# Patient Record
Sex: Female | Born: 1960 | Race: White | Hispanic: No | Marital: Married | Smoking: Current every day smoker
Health system: Southern US, Community
[De-identification: ages and names within clinical notes are randomized; demographics above are authoritative.]

## PROBLEM LIST (undated history)

## (undated) DIAGNOSIS — I726 Aneurysm of vertebral artery: Secondary | ICD-10-CM

## (undated) DIAGNOSIS — Z87898 Personal history of other specified conditions: Secondary | ICD-10-CM

## (undated) DIAGNOSIS — E041 Nontoxic single thyroid nodule: Secondary | ICD-10-CM

## (undated) DIAGNOSIS — I10 Essential (primary) hypertension: Secondary | ICD-10-CM

## (undated) DIAGNOSIS — R519 Headache, unspecified: Secondary | ICD-10-CM

## (undated) DIAGNOSIS — R7301 Impaired fasting glucose: Secondary | ICD-10-CM

## (undated) DIAGNOSIS — J45909 Unspecified asthma, uncomplicated: Secondary | ICD-10-CM

## (undated) DIAGNOSIS — M1990 Unspecified osteoarthritis, unspecified site: Secondary | ICD-10-CM

## (undated) DIAGNOSIS — Z8489 Family history of other specified conditions: Secondary | ICD-10-CM

## (undated) DIAGNOSIS — D649 Anemia, unspecified: Secondary | ICD-10-CM

## (undated) HISTORY — DX: Nontoxic single thyroid nodule: E04.1

## (undated) HISTORY — PX: CHOLECYSTECTOMY OPEN: SUR202

## (undated) HISTORY — DX: Essential (primary) hypertension: I10

## (undated) HISTORY — DX: Personal history of other specified conditions: Z87.898

## (undated) HISTORY — DX: Impaired fasting glucose: R73.01

## (undated) HISTORY — PX: PLACEMENT OF BREAST IMPLANTS: SHX6334

## (undated) HISTORY — DX: Aneurysm of vertebral artery: I72.6

## (undated) HISTORY — PX: OTHER SURGICAL HISTORY: SHX169

## (undated) HISTORY — PX: ABDOMINAL HYSTERECTOMY: SHX81

---

## 2001-05-30 ENCOUNTER — Encounter: Payer: Self-pay | Admitting: Family Medicine

## 2001-05-30 ENCOUNTER — Ambulatory Visit (HOSPITAL_COMMUNITY): Admission: RE | Admit: 2001-05-30 | Discharge: 2001-05-30 | Payer: Self-pay | Admitting: Family Medicine

## 2001-11-15 HISTORY — PX: ESOPHAGOGASTRODUODENOSCOPY: SHX1529

## 2002-10-15 ENCOUNTER — Encounter: Payer: Self-pay | Admitting: *Deleted

## 2002-10-15 ENCOUNTER — Emergency Department (HOSPITAL_COMMUNITY): Admission: EM | Admit: 2002-10-15 | Discharge: 2002-10-15 | Payer: Self-pay | Admitting: *Deleted

## 2002-10-31 ENCOUNTER — Ambulatory Visit (HOSPITAL_COMMUNITY): Admission: RE | Admit: 2002-10-31 | Discharge: 2002-10-31 | Payer: Self-pay | Admitting: Internal Medicine

## 2003-07-27 ENCOUNTER — Emergency Department (HOSPITAL_COMMUNITY): Admission: EM | Admit: 2003-07-27 | Discharge: 2003-07-27 | Payer: Self-pay | Admitting: *Deleted

## 2004-06-19 ENCOUNTER — Ambulatory Visit (HOSPITAL_COMMUNITY): Admission: RE | Admit: 2004-06-19 | Discharge: 2004-06-19 | Payer: Self-pay | Admitting: Family Medicine

## 2007-04-01 ENCOUNTER — Emergency Department (HOSPITAL_COMMUNITY): Admission: EM | Admit: 2007-04-01 | Discharge: 2007-04-01 | Payer: Self-pay | Admitting: Emergency Medicine

## 2007-04-18 ENCOUNTER — Ambulatory Visit (HOSPITAL_COMMUNITY): Admission: RE | Admit: 2007-04-18 | Discharge: 2007-04-18 | Payer: Self-pay | Admitting: Family Medicine

## 2007-06-12 ENCOUNTER — Observation Stay (HOSPITAL_COMMUNITY): Admission: RE | Admit: 2007-06-12 | Discharge: 2007-06-13 | Payer: Self-pay | Admitting: Obstetrics and Gynecology

## 2007-06-12 ENCOUNTER — Encounter: Payer: Self-pay | Admitting: Obstetrics and Gynecology

## 2009-09-10 ENCOUNTER — Ambulatory Visit (HOSPITAL_COMMUNITY): Admission: RE | Admit: 2009-09-10 | Discharge: 2009-09-10 | Payer: Self-pay | Admitting: Internal Medicine

## 2010-12-06 ENCOUNTER — Encounter: Payer: Self-pay | Admitting: Family Medicine

## 2011-03-30 NOTE — Op Note (Signed)
Darlene [name], Darlene [name]                 ACCOUNT NO.:  [id number]   MEDICAL RECORD NO.:  [id number]          PATIENT TYPE:  INP   LOCATION:  A313                          FACILITY:  [facility]   PHYSICIAN:  Dennie Maizes, M.D.   DATE OF BIRTH:  1960/12/22   DATE OF PROCEDURE:  06/12/2007  DATE OF DISCHARGE:                               OPERATIVE REPORT   INTRAOPERATIVE CONSULTATION/OPERATIVE REPORT:   PREOPERATIVE DIAGNOSIS:  Possible bladder injury during vaginal  hysterectomy.   POSTOPERATIVE DIAGNOSIS:  No bladder injury.   OPERATIVE PROCEDURE:  Cystoscopy.   ANESTHESIA:  General.   SURGEON:  Dennie Maizes, M.D.   ESTIMATED BLOOD LOSS:  Minimal.   DRAINS:  16-French Foley catheter in the bladder.   COMPLICATIONS:  None.   INDICATIONS FOR PROCEDURE:  This 50 year old female had metromenorrhagia  and hydrosalpinx.  She was scheduled to undergo laparoscopy assisted  vaginal hysterectomy, bilateral salpingo-oophorectomy and  hydrosalpingectomy by Dr. Emelda Fear.  During the procedure, he suspected  bladder injury.  I was asked to see the patient intraoperatively to  evaluate the bladder.   DESCRIPTION OF PROCEDURE:  Dr. Emelda Fear had completed lap assisted  vaginal hysterectomy.  He was closing the vaginal incision.  The Foley  catheter was removed.  The patient had her bladder filled with sterile  milk.  Irrigation of the bladder was done after introducing the  cystoscope.  The bladder was then closely inspected with 30 degree and  70 degree lenses.  The trigone, ureteral orifice and bladder mucosa were  normal.  There was no evidence  of any injury to the bladder.  While the bladder was being irrigated,  there was no leak of urine through the vagina.  There was no evidence of  any bladder injury.  The Foley catheter was then reinserted.  Dr.  Emelda Fear then proceeded with the surgery.      Dennie Maizes, M.D.  Electronically Signed     SK/MEDQ  D:  06/12/2007  T:   06/12/2007  Job:  [id number]

## 2011-03-30 NOTE — Op Note (Signed)
Darlene [name], Darlene [name]                 ACCOUNT NO.:  [id number]   MEDICAL RECORD NO.:  [id number]          PATIENT TYPE:  INP   LOCATION:  A313                          FACILITY:  [facility]   PHYSICIAN:  Tilda Burrow, M.D. DATE OF BIRTH:  Mar 09, 1961   DATE OF PROCEDURE:  06/12/2007  DATE OF DISCHARGE:                               OPERATIVE REPORT   PREOP:  1. Menometrorrhagia.  2. Hydrosalpinx.   POSTOP:  1. Menometrorrhagia.  2. Right paratubal cyst.   PROCEDURE:  1. Laparoscopically-assisted vaginal hysterectomy.  2. Excision right paratubal cyst.   INTRAOPERATIVE CONSULTS:  Dennie Maizes, with cystoscopy.   SURGEON:  1. Ferguson for hysterectomy and excision of cyst.  2. Cystoscopy, Dr. Rito Ehrlich.   ANESTHESIA:  General.   COMPLICATIONS:  None.   FINDINGS:  1. Mobile, twice normal size, uterus with irregular boggy tissues.  2. Normal appearing tubes bilaterally.  3. Normal ovaries.  4. Right paratubal cyst.  5. No evidence of endometriosis or scarring.   DETAILS OF PROCEDURE:  The patient was taken to the operating room,  prepped and draped for combined abdominal and vaginal procedure with  Foley catheter in place and abdomen and vagina areas prepped and draped.  An infraumbilical vertical skin incision was made as well as a  transverse suprapubic skin incision.  A Veress needle was introduced  through the umbilicus, being careful to orient the needle toward the  pelvis and water droplet technique confirmed the intraperitoneal  location of the needle with 3L CO2 instilled.  Laparoscopic trocar  inserted under direct visualization and inspection of the pelvis  performed.  Photos were taken.  The suprapubic trocar was placed under  direct visualization, attention first directed to the right side.  First, a right lower quadrant 10 mm trocar was placed under direct  visualization so we could use the ligature bipolar cutting and  coagulating device.  The paratubal cyst  was excised.  We then used the  uterine manipulator that had been placed per vagina to manipulate the  uterus such that we could take down the round ligament, utero-ovarian  ligament and broad ligament using the ligature, this was performed on  both sides.  This was taken down to just above the uterine vessels, and  the bladder flap then mobilized using sharp Metzenbaum scissors to  develop a bladder flap.  Blunt dissection using spreading technique was  used to identify the vesicouterine space sufficiently, to easily enter  the vesicouterine space.   At this point we deflated the abdomen, left the trocars in place with  instruments out of the abdomen, and went vaginally for the remainder of  the case.  The weighted speculum was placed in the posterior vagina, the  cervix grasped with a single-tooth tenaculum and circumscribed with  unipolar cautery.  The posterior colpotomy incision was then performed  with Mayo scissors, the peritoneal cavity entered and the uterosacral  ligaments on each side clamped with Zeppelin clamp, transected and #0  chromic suture ligation performed.  The lower cardinal ligaments were  then clamped, cut and suture ligated, the  bladder flap having been  elevated anteriorly.  We then were able to enter completely using blunt  dissection with the index finger into the anterior vesicouterine space  from below as we dealt with the upper cardinal ligaments on either side,  clamping, cutting and suture ligating on either side.  There was a  greater amount of fluid from the anterior vesicouterine reflection than  was expected, probably 20-40 mL of apparently lightly bloody stained  fluid, raising the question of peritoneal fluid vs. urine.  We attempted  to assess this by first placing 120 mL of sterile water in the bladder,  waiting for 5 minutes and then aspirating it and all that was obtained.  The sterile milk was placed in the bladder as well, 120 mL, and left in   for the remainder of the resection.  Intraoperative consult with Dr. Mariann Barter  peptide was called.  We then marched up the remainder of the broad  ligament, clamping, cutting and suture ligating on each side using #0  Chromic suture ligature as needed after a Zeppelin clamp and Mayo  scissor transection of pedicles.  Once the uterine specimen had been  taken out, there had been no evidence of any sterile milk egress into  the surgical area.  This result was discussed with Dr. Rito Ehrlich and  decision made to proceed with cystoscopy as an additional precaution.  Cystoscopy was performed by Dr. Rito Ehrlich, see his notes for details.  There was no evidence of injury or bleeding, injury to the bladder on  his cystoscopy.   The Foley catheter was reinserted at the completion of Dr. Chancy Milroy  procedure and the peritoneum then closed.  We first attached the cul-de-  sac to the right uterosacral ligament with a suture of #2-0 Prolene,  which would give [place] good vaginal apex support, and then the peritoneal  cavity was closed using running locking #2-0 Chromic across in a  transverse fashion.  The vaginal cuff was then closed, first attaching  some of the cul-de-sac with a #2-0 Chromic suture to the uterosacral  ligament and lower cardinal ligament pedicle on each side, then the  remainder of the cuff was closed using #2-0 chromic running fashion to  close the anterior flap to the posterior skin flap to the vaginal apex.  Patient tolerated procedure well, vaginal packing was placed.   ESTIMATED BLOOD LOSS:  100 mL.   SPONGE AND NEEDLE COUNTS:  Correct throughout the case.   We then went up to the abdominal wall, changed gloves and closed the  laparoscopic ports by removal of the ports, placing 60 mL of saline in  the abdominal cavity, closing the right lower quadrant and umbilical  site at the level of the fascia with #0 Vicryl and then subcuticular #3-  0 Vicryl at each site before Steri-stripping the  incisions.  Hemostasis  was excellent.  Sponge and needle counts correct.  The patient went to  recovery room in excellent condition.     Tilda Burrow, M.D.  Electronically Signed    JVF/MEDQ  D:  06/12/2007  T:  06/12/2007  Job:  [id number]

## 2011-03-30 NOTE — Discharge Summary (Signed)
Darlene [name], Darlene [name]                 ACCOUNT NO.:  [id number]   MEDICAL RECORD NO.:  [id number]          PATIENT TYPE:  INP   LOCATION:  A313                          FACILITY:  [facility]   PHYSICIAN:  Tilda Burrow, M.D. DATE OF BIRTH:  08/05/1961   DATE OF ADMISSION:  06/12/2007  DATE OF DISCHARGE:  07/29/2008LH                               DISCHARGE SUMMARY   ADMISSION DIAGNOSES:  1. Menometrorrhagia.  2. Right hydrosalpinx.   POSTOPERATIVE DIAGNOSES:  1. Menometrorrhagia.  2. Right paratubal cyst.  3. No evidence of bladder injury.   PROCEDURE:  Laparoscopically-assisted vaginal hysterectomy with right  paratubal cyst removal, Tilda Burrow, M.D.  Intraoperative consult  and cystoscopy, Dennie Maizes, M.D.   HOSPITAL SUMMARY:  This 50 year old gravida 2, para 2, referred by Dr.  Gerda Diss for heavy menses x1 year is admitted for a hysterectomy with a  small uterus, suspected bilateral hydrosalpinx adjacent to normal  ovaries.  See HPI for details.  She was admitted with a hemoglobin of  15, hematocrit of 44, potassium 5.1, rechecked at 4.9.  BUN and  creatinine was at 9.0.   HOSPITAL COURSE:  The patient underwent laparoscopically-assisted  vaginal hysterectomy.  Laparoscopy revealed that the tubes were grossly  normal but there was a paratubal cyst just below the right tube, which  was taken off.  The laparoscopically-assisted vaginal hysterectomy was  straightforward, with one concern being developed, namely that we were  concerned about an increased amount of peritoneal fluid encountered  during the dissection of the vesicouterine space raising the question of  bladder injury, and so therefore we asked Dr. Rito Ehrlich to evaluate in  addition to our intraoperative evaluation with sterile milk.  The  patient had no evidence of bladder injury.  Postoperatively the patient  did well, was sitting up with active bowel sounds, tolerating a regular  diet, afebrile, with hematocrit  10.6, hematocrit 31.2 on postop day #1.  She will be discharged home for follow-up in 2 weeks at our  office with discharge medications consisting of Percocet 5/325 mg,  Toradol 10 mg q.6h. p.r.n. cramping, and doxycycline 100 mg b.i.d. x5  days as prophylaxis.  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      Tilda Burrow, M.D.  Electronically Signed     JVF/MEDQ  D:  06/13/2007  T:  06/13/2007  Job:  [id number]   cc:   Lorin Picket A. Gerda Diss, MD  Fax: [phone]

## 2011-04-02 NOTE — Op Note (Signed)
NAME:  Darlene [name], Darlene [name]                           ACCOUNT NO.:  [id number]   MEDICAL RECORD NO.:  [id number]                   PATIENT TYPE:  AMB   LOCATION:  DAY                                  FACILITY:  [facility]   PHYSICIAN:  R. Roetta Sessions, M.D.              DATE OF BIRTH:  Apr 06, 1961   DATE OF PROCEDURE:  10/31/2002  DATE OF DISCHARGE:                                 OPERATIVE REPORT   PROCEDURE:  Diagnostic esophagogastroduodenoscopy.   ENDOSCOPIST:  Gerrit Friends. Rourk, M.D.   INDICATIONS FOR PROCEDURE:  The patient is a 50 year old lady with  intermittent epigastric gnawing abdominal pain.  She takes ibuprofen  regularly.  We saw her in the office on 10/19/02 and recommended that she  increase her Nexium to 40 gm b.i.d. and add Benefiber and NuLev to her  regimen.  She has not done any of the above.  In fact she has run out of her  Nexium.  She is not taking Benefiber or NuLev.  EGD is now being done to  further evaluate her symptoms.  The approach has been discussed with the  patient.  The potential risks, benefits, and alternatives have been  reviewed; and questions answered.  ASA I.   DESCRIPTION OF PROCEDURE:  O2 saturation, blood pressure, pulse and  respirations were monitored throughout the entire procedure.   CONSCIOUS SEDATION:  Versed 5 mg IV, Demerol 75 mg IV in divided doses,  Cetacaine spray for topical oropharyngeal anesthesia.   INSTRUMENT:  Olympus video chip adult gastroscope.   FINDINGS:  Examination of the tubular esophagus revealed no mucosa  abnormalities.  The EG junction was easily traversed.   STOMACH:  The gastric cavity was empty.  It insufflated well with air.  A  thorough examination of the gastric mucosa including a retroflex view of the  proximal stomach and esophagogastric junction demonstrated multiple linear  antral erosions.  There was no ulcer or infiltrating process or other  abnormality.  The pylorus was patent and easily traversed.   DUODENUM:  The bulb and the second portion were well seen.  No posterior  bulbar erosions.  Otherwise D1 and D2 appeared normal.   THERAPY/DIAGNOSTIC MANEUVERS:  None.   The patient tolerated the procedure well and was reacted in endoscopy.   IMPRESSION:  1. Normal esophagus.  2. Antral and bulbar erosions.  The remainder of the gastric mucosa and     duodenum through the second portion appeared normal.   RECOMMENDATIONS:  1. Begin Nexium 40 mg orally b.i.d. for the next month.  2. NuLev 1 tablet before meals and at bedtime orally p.r.n. epigastric     discomfort.  3. Will go ahead and check LFT, CBC, and amylase and obtain H. pylori     serologies.  4. Office visit with [place] to assess her progress in 1 month.  Jonathon Bellows, M.D.    RMR/MEDQ  D:  10/31/2002  T:  10/31/2002  Job:  [id number]   cc:   Lorin Picket A. Gerda Diss, M.D.  [address], [address]  [place]  [place] [address]  Fax: [phone]

## 2011-04-02 NOTE — Consult Note (Signed)
Darlene [name], Darlene [name]                             ACCOUNT NO.:  [id number]   MEDICAL RECORD NO.:  [id number]                  PATIENT TYPE:   LOCATION:                                       FACILITY:   PHYSICIAN:  R. Roetta Sessions, M.D.              DATE OF BIRTH:  06-01-1961   DATE OF CONSULTATION:  10/19/2002  DATE OF DISCHARGE:                                   CONSULTATION   PHYSICIAN REQUESTING CONSULTATION:  Donna Bernard, M.D.   REASON FOR CONSULTATION:  Epigastric pain.   HISTORY OF PRESENT ILLNESS:  The patient is a 50 year old Caucasian female  patient of Dr. Lubertha South who presents today for further evaluation of  recurrent epigastric pain.  She gives a chronic history of epigastric  discomfort.  She says she has had symptoms for many years.  She often has a  constant gnawing-type feeling in her epigastric region.  Recently, however,  she has begun having recurrent severe attacks where she develops spasm-like  epigastric pain which radiates into her back.  It generally lasts for 15-30  minutes at a time.  The other day, she had symptoms for over an hour, at  which time she broke out with a cold sweat and had nausea and vomiting.  She  went to the emergency department and reportedly had an acute abdominal  series which was negative.  She denies having any other kind of blood tests  or workup.  She was given Demerol and GI cocktail and the pain resolved.  She had another attack this morning and took GI cocktail with relief.  She  denies any typical heartburn symptoms, dysphagia, odynophagia, weight loss,  diarrhea, melena, or rectal bleeding.  She does have some constipation.  Generally goes to the bathroom only every few days or even just once a week.  Her severe epigastric pain attacks that she has generally wake her up from  sleep.  They are usually not related to eating.  She says that the pain is  similar to when she had gallbladder attacks in the past.  She had  her  gallbladder removed in 1995 for gallstones.   She does admit to taking lots of ibuprofen for lower back pain and knee  pain.  She is taking 800 mg once to twice daily.  She has had to come off of  ibuprofen previously because of significant abdominal pain.  She has been on  Nexium for several months but takes it sporadically.  She will take it for a  week or two at a time and then get to feeling better and will stop the  medication.  Currently, she is taking it about once every other day.  She  says she was asked to increase to twice a day by Dr. Lubertha South.  She has  yet to do this.   CURRENT MEDICATIONS:  1. Nexium 40 mg  p.o. q.o.d.  2. Tylenol p.r.n.  3. Ibuprofen 800 mg 1-2 times daily.   ALLERGIES:  Codeine causes panic attacks.   PAST MEDICAL HISTORY:  She gives history of arthritis but no other chronic  illnesses.   PAST SURGICAL HISTORY:  Cholecystectomy for cholelithiasis in 1995.   FAMILY HISTORY:  Mother has a history of H. pylori, status post treatment.  Father had triple coronary artery bypass graft.  No family history of  colorectal cancer.   SOCIAL HISTORY:  She has been married for 24 years.  She has three children.  She works for [employer] in [employer].  She smokes one pack of  cigarettes daily.  She has smoked for 15 years.  Denies any alcohol use.   REVIEW OF SYSTEMS:  Please see HPI for GI.  GENERAL:  Denies any weight  loss.  CARDIOPULMONARY:  Denies any shortness of breath or chest pain.  GENITOURINARY:  Denies any dysuria or urinary frequency or hematuria.  She  complains of severe menstrual cramps.   PHYSICAL EXAMINATION:  VITAL SIGNS:  Weight 134.7 pounds, height 5 feet 3  inches, temperature 98.4 degrees, blood pressure 110/80, pulse 82.  GENERAL:  A pleasant, well-nourished, well-developed Caucasian female in no  acute distress.  SKIN:  Warm and dry.  No jaundice.  HEENT:  Pupils are equal, round, and reactive to light.   Conjunctivae are  pink.  Sclerae are nonicteric.  Oropharyngeal mucosa moist and pink.  No  lesions, erythema, or exudate.  No lymphadenopathy or thyromegaly.  CHEST:  Lungs are clear to auscultation.  CARDIAC:  Regular rate and rhythm.  Normal S1, S2.  No murmurs, rubs, or  gallops.  ABDOMEN:  Positive bowel sounds.  Soft, nondistended.  Mild epigastric  tenderness to deep palpation.  No organomegaly or masses  EXTREMITIES:  No edema.   IMPRESSION:  1. The patient is a pleasant 50 year old lady who has chronic epigastric     gnawing-type discomfort but recently has developed several episodes of     acute epigastric spasm-like pain.  She has been taking lots of ibuprofen     and therefore may have gastritis or even peptic ulcer disease.  She noted     improvement previously when taking Nexium but had not been on this     regularly recently.  She feels like these attacks of epigastric pain are     similar to previous gallbladder attacks.  She could be passing stones or     has sphincter of Oddi dysfunction; however, this would be less likely     given that her symptoms generally do not occur postprandially.  2. Chronic constipation.   PLAN:  1. She will take her Nexium 40 mg p.o. q.d. to b.i.d.  2. Benefiber one packet daily.  Five days of samples given.  3. She will avoid nonsteroidal anti-inflammatory drugs and aspirin for now.  4. NuLev one tablet sublingual q.i.d. p.r.n. abdominal pain, #18 samples     given.  5. Esophagogastroduodenoscopy in the near future.  6. Further recommendations to follow.   I would like to thank Dr. Lubertha South for allowing [place] to take part in the  care of this patient.     [name], [name], M.D.    LL/MEDQ  D:  10/19/2002  T:  10/19/2002  Job:  [id number]  

## 2011-08-30 LAB — CBC
HCT: 31.2 — ABNORMAL LOW
HCT: 37.6
Hemoglobin: 10.6 — ABNORMAL LOW
Hemoglobin: 13
MCHC: 34
MCHC: 34.6
MCV: 95.7
MCV: 97.6
Platelets: 213
Platelets: 297
RBC: 3.2 — ABNORMAL LOW
RBC: 3.93
RDW: 14.2 — ABNORMAL HIGH
RDW: 14.3 — ABNORMAL HIGH
WBC: 11.6 — ABNORMAL HIGH
WBC: 6

## 2011-08-30 LAB — TYPE AND SCREEN
ABO/RH(D): AB NEG
Antibody Screen: NEGATIVE

## 2011-08-30 LAB — COMPREHENSIVE METABOLIC PANEL WITH GFR
ALT: 14
AST: 20
Albumin: 3.9
Alkaline Phosphatase: 50
BUN: 9
CO2: 28
Calcium: 9.7
Chloride: 99
Creatinine, Ser: 0.62
GFR calc non Af Amer: >60
Glucose, Bld: 93
Potassium: 5.1
Sodium: 133 — ABNORMAL LOW
Total Bilirubin: 0.7
Total Protein: 6.3

## 2011-08-30 LAB — DIFFERENTIAL
Basophils Absolute: 0.1
Basophils Relative: 1
Eosinophils Absolute: 0
Eosinophils Relative: 0
Lymphocytes Relative: 19
Lymphs Abs: 2.2
Monocytes Absolute: 0.5
Monocytes Relative: 5
Neutro Abs: 8.8 — ABNORMAL HIGH
Neutrophils Relative %: 75

## 2011-08-30 LAB — HCG, QUANTITATIVE, PREGNANCY

## 2011-08-30 LAB — POCT I-STAT 4, (NA,K, GLUC, HGB,HCT)
Operator id: 237551
Sodium: 134 — ABNORMAL LOW

## 2011-09-06 ENCOUNTER — Emergency Department (HOSPITAL_COMMUNITY)
Admission: EM | Admit: 2011-09-06 | Discharge: 2011-09-06 | Disposition: A | Discharge status: Other Acute Inpatient Hospital | Payer: Worker's Compensation | Source: Home / Self Care | Attending: Emergency Medicine | Admitting: Emergency Medicine

## 2011-09-06 ENCOUNTER — Emergency Department (HOSPITAL_COMMUNITY): Payer: Worker's Compensation

## 2011-09-06 ENCOUNTER — Observation Stay (HOSPITAL_COMMUNITY)
Admission: EM | Admit: 2011-09-06 | Discharge: 2011-09-07 | Disposition: A | Discharge status: Home / Self Care | Payer: Worker's Compensation | Attending: General Surgery | Admitting: General Surgery

## 2011-09-06 DIAGNOSIS — Y929 Unspecified place or not applicable: Secondary | ICD-10-CM | POA: Insufficient documentation

## 2011-09-06 DIAGNOSIS — S61209A Unspecified open wound of unspecified finger without damage to nail, initial encounter: Principal | ICD-10-CM | POA: Insufficient documentation

## 2011-09-06 DIAGNOSIS — IMO0002 Reserved for concepts with insufficient information to code with codable children: Secondary | ICD-10-CM | POA: Insufficient documentation

## 2011-09-06 DIAGNOSIS — F17200 Nicotine dependence, unspecified, uncomplicated: Secondary | ICD-10-CM | POA: Insufficient documentation

## 2011-09-06 DIAGNOSIS — S62639B Displaced fracture of distal phalanx of unspecified finger, initial encounter for open fracture: Secondary | ICD-10-CM | POA: Insufficient documentation

## 2011-09-06 DIAGNOSIS — W01119A Fall on same level from slipping, tripping and stumbling with subsequent striking against unspecified sharp object, initial encounter: Secondary | ICD-10-CM | POA: Insufficient documentation

## 2011-09-06 DIAGNOSIS — S61409A Unspecified open wound of unspecified hand, initial encounter: Secondary | ICD-10-CM | POA: Insufficient documentation

## 2011-09-06 DIAGNOSIS — W268XXA Contact with other sharp object(s), not elsewhere classified, initial encounter: Secondary | ICD-10-CM | POA: Insufficient documentation

## 2011-09-06 DIAGNOSIS — Y990 Civilian activity done for income or pay: Secondary | ICD-10-CM | POA: Insufficient documentation

## 2011-09-06 DIAGNOSIS — S65509A Unspecified injury of blood vessel of unspecified finger, initial encounter: Secondary | ICD-10-CM | POA: Insufficient documentation

## 2011-09-06 DIAGNOSIS — I10 Essential (primary) hypertension: Secondary | ICD-10-CM | POA: Insufficient documentation

## 2011-09-06 MED ORDER — TETANUS-DIPHTH-ACELL PERTUSSIS 5-2.5-18.5 LF-MCG/0.5 IM SUSP
0.5000 mL | Freq: Once | INTRAMUSCULAR | Status: AC
  Administered 2011-09-06: 0.5 mL via INTRAMUSCULAR
  Filled 2011-09-06: qty 0.5

## 2011-09-06 MED ORDER — ONDANSETRON HCL 4 MG/2ML IJ SOLN
4.0000 mg | Freq: Once | INTRAMUSCULAR | Status: AC
  Administered 2011-09-06: 4 mg via INTRAVENOUS
  Filled 2011-09-06: qty 2

## 2011-09-06 MED ORDER — MORPHINE SULFATE 4 MG/ML IJ SOLN
4.0000 mg | Freq: Once | INTRAMUSCULAR | Status: AC
  Administered 2011-09-06: 4 mg via INTRAVENOUS
  Filled 2011-09-06: qty 1

## 2011-09-06 MED ORDER — HYDROMORPHONE HCL 1 MG/ML IJ SOLN
1.0000 mg | Freq: Once | INTRAMUSCULAR | Status: AC
  Administered 2011-09-06: 1 mg via INTRAVENOUS
  Filled 2011-09-06: qty 1

## 2011-09-06 MED ORDER — CEFAZOLIN SODIUM 1-5 GM-% IV SOLN
1.0000 g | Freq: Once | INTRAVENOUS | Status: AC
  Administered 2011-09-06: 1 g via INTRAVENOUS
  Filled 2011-09-06: qty 50

## 2011-09-06 NOTE — ED Provider Notes (Addendum)
History     CSN: [id number] Arrival date & time: 09/06/2011  4:43 PM   First MD Initiated Contact with Patient on arrival    No chief complaint on file.   (Consider location/radiation/quality/duration/timing/severity/associated sxs/prior treatment) Patient is a 50 y.o. female presenting with skin laceration. The history is provided by the patient.  Laceration  The incident occurred less than 1 hour ago. The laceration is located on the left hand. The laceration is 3 cm in size. The pain is moderate. The pain has been constant since onset. Her tetanus status is unknown.  pt was carrying metal, tripped and fell and cut her left hand.  No other injury.  Denies Head injury  Past Medical History  Diagnosis Date  . Hypertension     History reviewed. No pertinent past surgical history.  History reviewed. No pertinent family history.  History  Substance Use Topics  . Smoking status: Current Everyday Smoker    Types: Cigarettes  . Smokeless tobacco: Not on file  . Alcohol Use: No    OB History    Grav Para Term Preterm Abortions TAB SAB Ect Mult Living                  Review of Systems  All other systems reviewed and are negative.    Allergies  Review of patient's allergies indicates no known allergies.  Home Medications  No current outpatient prescriptions on file.  BP 118/71  Pulse 65  Temp(Src) 98 F (36.7 C) (Oral)  Resp 20  SpO2 99%  Physical Exam CONSTITUTIONAL: Well developed/well nourished HEAD AND FACE: Normocephalic/atraumatic EYES: EOMI ENMT: Mucous membranes moist NECK: supple no meningeal signs CV: , no murmurs/rubs/gallops noted LUNGS: brief scattered wheezes, no distress ABDOMEN: soft, nontender, no rebound or guarding NEURO: Pt is awake/alert, moves all extremitiesx4 She is unable move her left thumb and she is unable to flex the left index finger EXTREMITIES: large/deep laceration noted to palm of left hand on distal surface just proximal  to MCP, it extends from 2nd MCP laterally to 4th MCP.  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  No active bleeding.   Distal sensation not intact on left thumb/index finger.   SKIN: warm, color normal PSYCH: anxious    ED Course  Procedures (including critical care time)   MDM  Nursing notes reviewed and considered in documentation xrays reviewed and considered  4:53 PM  Ring on left ring finger removed and given to patient, tdap/abx ordered Wound cleansed with tap water and wrapped with gauze Will call into Hand    5:15 PM D/w dr Mina Marble Will accept patient at [facility] cone Start abx/tdap Will call into ED physician at [facility]  5:42 PM  Pt stable, informed of diagnosis/treatment Still without sensation to left thumb/index finger  Joya Gaskins, MD 09/06/11 1742  Joya Gaskins, MD 09/08/11 1210

## 2011-09-06 NOTE — ED Notes (Signed)
Pt states she was carrying mowing a mowing deck and it fell catching her left hand between it and cement

## 2011-09-06 NOTE — ED Notes (Signed)
Report given to Martie Lee, Forensic scientist at [facility]; pt left by [facility]; IV saline locked

## 2011-09-06 NOTE — ED Notes (Signed)
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 Bebe Shaggy in with pt and rewrapped hand;

## 2011-09-09 NOTE — Op Note (Addendum)
Darlene [name], KLIMAS                 ACCOUNT NO.:  [id number]  MEDICAL RECORD NO.:  [id number]  LOCATION:  APA11                         FACILITY:  [facility]  PHYSICIAN:  Artist Pais. Nyiah Pianka, M.D.DATE OF BIRTH:  1961-06-07  DATE OF PROCEDURE:  09/06/2011 DATE OF DISCHARGE:  09/06/2011                              OPERATIVE REPORT   PREOPERATIVE DIAGNOSIS:  Deep lacerations, left thumb and left hand.  POSTOPERATIVE DIAGNOSIS:  Deep lacerations, left thumb and left hand.  PROCEDURE:  Irrigation and debridement above with a left thumb bilateral digital nerve repair microscopically with 9-0 nylon, left thumb ulnar digital artery repair using 10-0 nylon, and open treatment of distal phalangeal fracture with 0.035 K-wires x2 as well as left index finger, zone 2, superficialis and profundus repairs as well as microscopic repair of radial and ulnar digital nerves and open treatment of proximal phalangeal base fracture.  SURGEON:  Artist Pais. Mina Marble, MD  ASSISTANT:  None.  ANESTHESIA:  General.  TOURNIQUET TIME:  2 hours followed by 10 minutes of downtime followed by another 28 minutes.  COMPLICATIONS:  None.  DRAINS:  None.  DESCRIPTION OF PROCEDURE:  The patient was taken to the operating suite. After induction of adequate general anesthesia, left upper extremity was prepped and draped in sterile fashion.  An Esmarch was used to exsanguinate the limb.  Tourniquet inflated to 250 mmHg.  At this point in time, the left hand was approached surgically with a transverse laceration of the thumb of the IP joint with an open fracture of the base of the distal phalanx.  There was also a large open wound along the index and long finger of the distal palmar crease.  This was extended proximally and distally in the midlateral fashion into the middle of the palm and out to the PIP joint radial side of the index finger.  Flaps were raised accordingly.  Dissection revealed a complete  laceration, zone 2, superficialis profundus tendon of the index finger.  Complete lacerations of the digital nerve, radial ulnar, long finger neurosensory structures and tendons were intact.  Using a 3-0 double-armed Ethibond suture for the profundus and a 4-0 double-armed Ethibond suture for the superficialis, the tendons were repaired in zone 2 under no undue tension, and then finished with a 6-0 Prolene epitendinous stitch.  Once this was done, the microscope was brought onto the field and the ulnar digital nerve and radial digital nerve index finger were fixed microscopically with 9-0 nylon.  Attention was then paid to the thumb where under microscopic magnification, the ulnar digital artery and nerve repaired using 10-0 nylon for the artery and 9-0 nylon to the nerve and the radial digital nerve was repaired with 9-0 nylon as well. The wound was then thoroughly irrigated and was closed with 4-0 Vicryl Rapide sutures and a displaced fracture at the base of the distal phalanx was fixed from distal proximal using 0.035 K-wires driven using a wire driver.  The proximal phalangeal fracture of the index finger was treated closed and deemed stable.  The wounds were then thoroughly irrigated, closed with 4-0 Vicryl Rapide suture.  The patient then had a sterile dressing of Xeroform, 4 x  4s, fluffs, and a dorsal extension block splint applied. The patient tolerated the procedure well and returned to the recovery room in stable fashion.     Artist Pais Mina Marble, M.D.     MAW/MEDQ  D:  09/06/2011  T:  09/07/2011  Job:  [id number]  Electronically Signed by Dairl Ponder M.D. on 09/16/2011 04:25:51 PM

## 2011-09-09 NOTE — Consult Note (Signed)
  NAMESTUTI, SANDIN                 ACCOUNT NO.:  [id number]  MEDICAL RECORD NO.:  [id number]  LOCATION:  APA11                         FACILITY:  [facility]  PHYSICIAN:  Artist Pais. Cadance Raus, M.D.DATE OF BIRTH:  05/09/1961  DATE OF CONSULTATION:  09/06/2011 DATE OF DISCHARGE:  09/06/2011                                CONSULTATION   REQUESTING PHYSICIAN:  Zadie Rhine, MD  REASON FOR CONSULTATION:  Wynne Rozak is a 50 year old right-hand- dominant female who had a deep laceration to the left thumb and left hand presented to [facility] with tendon artery nerve damage. X-rays did showed fractures of the distal phalanx of the left thumb and the proximal phalanx of left index finger.  She is 50 years old.  She is right-hand dominant.  ALLERGIES:  She has an allergy to CODEINE and STEROIDS.  MEDICATIONS:  She does not take any medications.  SOCIAL HISTORY:  She smokes a pack a day and occasional alcohol use.  PAST MEDICAL/SURGICAL HISTORY:  None.  She says she has been diagnosed with hypertension but does not take her meds.  PHYSICAL EXAMINATION:  Deep laceration to the volar aspect of the thumb at the IP joint, as well as in the hand overlying the left index and long finger at the distal palmar crease.  She does not have the ability to flex the index finger.  Long ring and small finger have normal flexion.  She is able to very weakly flex the IP joint in the thumb. She is numb at the tip of the thumb and the index finger to sharp/dull examination.  IMAGING:  X-rays reveal a fracture at the base of the distal phalanx of the thumb, left and a fracture at the base of the proximal phalanx, index finger, nondisplaced, left side.  IMPRESSION:  This is a 50 year old female with open wounds to her left hand including the thumb and index finger, tendon artery nerve damage as well as open fractures.  We discussed emergent trip to the operating room for repair as necessary with  probable 23-hour admission.  She understands its risks and benefits.  All these were explained to the patient who wished to proceed.  We will do this as soon as possible here at [facility].     Molli Hazard A. Mina Marble, M.D.    MAW/MEDQ  D:  09/06/2011  T:  09/07/2011  Job:  [id number]  Electronically Signed by Dairl Ponder M.D. on 09/09/2011 08:06:52 AM

## 2011-10-04 NOTE — Discharge Summary (Signed)
[name], [name]                 ACCOUNT NO.:  [id number]  MEDICAL RECORD NO.:  [id number]  LOCATION:  APA11                         FACILITY:  [facility]  PHYSICIAN:  Artist Pais. Gregorio Worley, M.D.DATE OF BIRTH:  25-Aug-1961  DATE OF ADMISSION:  09/06/2011 DATE OF DISCHARGE:  09/07/2011                              DISCHARGE SUMMARY   PRINCIPAL DIAGNOSIS:  Left hand laceration with flexor tendon nerve and bone injury to the thumb and index fingers, left side.  POSTOPERATIVE DIAGNOSIS:  Left hand laceration with flexor tendon nerve and bone injury to the thumb and index fingers, left side.  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  [name] was transferred from [facility] with work-related injury to the volar aspect of her left thumb and index fingers.  She was taken to the operating room, underwent primary repair of tendon and nerves as well as artery on the thumb, pinning of the fracture on the thumb, went to the floor postoperatively for less than 23 hours, was discharged the next morning stable to home.     Artist Pais Mina Marble, M.D.     MAW/MEDQ  D:  09/16/2011  T:  09/17/2011  Job:  [id number]

## 2012-02-07 ENCOUNTER — Other Ambulatory Visit (HOSPITAL_COMMUNITY): Payer: Self-pay | Admitting: Internal Medicine

## 2012-02-07 DIAGNOSIS — E079 Disorder of thyroid, unspecified: Secondary | ICD-10-CM

## 2012-02-10 ENCOUNTER — Ambulatory Visit (HOSPITAL_COMMUNITY): Payer: BC Managed Care – PPO

## 2013-05-20 ENCOUNTER — Ambulatory Visit (INDEPENDENT_AMBULATORY_CARE_PROVIDER_SITE_OTHER): Payer: PRIVATE HEALTH INSURANCE | Admitting: Family Medicine

## 2013-05-20 VITALS — BP 136/84 | HR 100 | Temp 98.1°F | Resp 16 | Ht 62.0 in | Wt 128.8 lb

## 2013-05-20 DIAGNOSIS — F17200 Nicotine dependence, unspecified, uncomplicated: Secondary | ICD-10-CM

## 2013-05-20 DIAGNOSIS — D7389 Other diseases of spleen: Secondary | ICD-10-CM

## 2013-05-20 DIAGNOSIS — R030 Elevated blood-pressure reading, without diagnosis of hypertension: Secondary | ICD-10-CM

## 2013-05-20 DIAGNOSIS — I160 Hypertensive urgency: Secondary | ICD-10-CM

## 2013-05-20 DIAGNOSIS — D649 Anemia, unspecified: Secondary | ICD-10-CM

## 2013-05-20 DIAGNOSIS — Z720 Tobacco use: Secondary | ICD-10-CM | POA: Insufficient documentation

## 2013-05-20 DIAGNOSIS — I10 Essential (primary) hypertension: Secondary | ICD-10-CM | POA: Insufficient documentation

## 2013-05-20 DIAGNOSIS — D7589 Other specified diseases of blood and blood-forming organs: Secondary | ICD-10-CM

## 2013-05-20 LAB — COMPREHENSIVE METABOLIC PANEL
ALT: 14 U/L (ref 0–35)
AST: 18 U/L (ref 0–37)
Albumin: 4.3 g/dL (ref 3.5–5.2)
Alkaline Phosphatase: 67 U/L (ref 39–117)
BUN: 10 mg/dL (ref 6–23)
Calcium: 9.4 mg/dL (ref 8.4–10.5)
Chloride: 99 mEq/L (ref 96–112)
Potassium: 4.9 mEq/L (ref 3.5–5.3)
Sodium: 130 mEq/L — ABNORMAL LOW (ref 135–145)
Total Protein: 6.6 g/dL (ref 6.0–8.3)

## 2013-05-20 LAB — POCT CBC
Granulocyte percent: 48.9 %G (ref 37–80)
MCV: 100.8 fL — AB (ref 80–97)
MID (cbc): 0.5 (ref 0–0.9)
POC Granulocyte: 3.2 (ref 2–6.9)
Platelet Count, POC: 264 10*3/uL (ref 142–424)
RBC: 3.65 M/uL — AB (ref 4.04–5.48)

## 2013-05-20 LAB — POCT URINALYSIS DIPSTICK
Blood, UA: NEGATIVE
Ketones, UA: NEGATIVE
Protein, UA: NEGATIVE
Spec Grav, UA: 1.01
Urobilinogen, UA: 0.2
pH, UA: 7

## 2013-05-20 MED ORDER — CHLORTHALIDONE 25 MG PO TABS: 25.0000 mg | ORAL_TABLET | Freq: Every day | ORAL | Status: DC

## 2013-05-20 NOTE — Progress Notes (Signed)
Subjective:    Patient ID: Darlene [name], female    DOB: 02/27/1961, 52 y.o.   MRN: [id number] No chief complaint on file.  HPI  BP normally about 145/90 - but doesn't take her BP medicine as it makes her feel bad - the last one she tried was Diovan - has been on several over the past several years.  In the past she has had several episodes of neck pressure but today it wouldn't go away.  It is on both sides but worse on the right.  Has this once every couple of months and is usually accompanied by mild right sided headache as it was today - no worse than usual.  However, since the neck pressure was worse and wouldn't go away, she had her daughter, an ICU nurse at [facility] check her BP - it was 175/105 so she can in for further eval.  Feels like right vision is pulling - feels hard to focus. 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 Normally followed by Dr. Margo Aye in [place] - PCP. Has not been seen there in a while. No CP or SHoB.  Has had a little bit of swelling in her legs. No presyncope.  Does feel slight imbalanced but no problems with gait.  Past Medical History  Diagnosis Date  . Hypertension    Current Outpatient Prescriptions on File Prior to Visit  Medication Sig Dispense Refill  . acetaminophen (TYLENOL) 500 MG tablet Take 500 mg by mouth as needed. For pain       . Ascorbic Acid (VITAMIN C) 1000 MG tablet Take 3,000 mg by mouth daily.        . diphenhydrAMINE (BENADRYL) 25 mg capsule Take 25 mg by mouth 2 (two) times daily as needed. For allergies        No current facility-administered medications on file prior to visit.   Allergies  Allergen Reactions  . Bee Venom Swelling and Rash      Review of Systems  Constitutional: Positive for fatigue. Negative for fever, chills, diaphoresis, appetite change and unexpected weight change.  HENT: Negative for ear pain, congestion, rhinorrhea, neck pain, neck stiffness, dental problem, sinus pressure and tinnitus.    Eyes: Positive for photophobia. Negative for pain, discharge and visual disturbance.  Gastrointestinal: Negative for nausea and vomiting.  Skin: Negative for rash.  Neurological: Positive for dizziness and headaches. Negative for tremors, seizures, syncope, facial asymmetry, speech difficulty, weakness, light-headedness and numbness.  Psychiatric/Behavioral: Negative for sleep disturbance.      BP 172/98 Objective:   Physical Exam  Constitutional: She is oriented to person, place, and time. She appears well-developed and well-nourished. She is active.  HENT:  Head: Normocephalic and atraumatic.  Right Ear: External ear normal.  Left Ear: External ear normal.  Nose: Nose normal.  Mouth/Throat: Uvula is midline, oropharynx is clear and moist and mucous membranes are normal. No oropharyngeal exudate.  Eyes: Conjunctivae, EOM and lids are normal. Pupils are equal, round, and reactive to light. No scleral icterus.  Fundoscopic exam:      The right eye shows no arteriolar narrowing, no AV nicking, no hemorrhage and no papilledema.       The left eye shows no arteriolar narrowing, no AV nicking, no hemorrhage and no papilledema.  Neck: Normal range of motion. Neck supple. No thyromegaly present.  Cardiovascular: Normal rate, regular rhythm, S1 normal, S2 normal, normal heart sounds and intact distal pulses.  Exam reveals no gallop.  No murmur heard. Pulses:      Carotid pulses are 2+ on the right side, and 2+ on the left side. No carotid bruits  Pulmonary/Chest: Effort normal and breath sounds normal. No respiratory distress.  Neurological: She is alert and oriented to person, place, and time. She has normal strength and normal reflexes. She displays no tremor. No cranial nerve deficit or sensory deficit. She exhibits normal muscle tone. She displays a negative Romberg sign. Coordination and gait normal.  Skin: Skin is warm and dry. She is not diaphoretic.  Psychiatric: She has a normal  mood and affect. Her behavior is normal.    EKG: NSR, no ischemic changes.    Results for orders placed in visit on 05/20/13  POCT CBC      Result Value Range   WBC 6.6  4.6 - 10.2 K/uL   Lymph, poc 2.8  0.6 - 3.4   POC LYMPH PERCENT 42.8  10 - 50 %L   MID (cbc) 0.5  0 - 0.9   POC MID % 8.3  0 - 12 %M   POC Granulocyte 3.2  2 - 6.9   Granulocyte percent 48.9  37 - 80 %G   RBC 3.65 (*) 4.04 - 5.48 M/uL   Hemoglobin 11.4 (*) 12.2 - 16.2 g/dL   HCT, POC 11.9 (*) 14.7 - 47.9 %   MCV 100.8 (*) 80 - 97 fL   MCH, POC 31.2  27 - 31.2 pg   MCHC 31.0 (*) 31.8 - 35.4 g/dL   RDW, POC 82.9     Platelet Count, POC 264  142 - 424 K/uL   MPV 8.6  0 - 99.8 fL  POCT URINALYSIS DIPSTICK      Result Value Range   Color, UA yellow     Clarity, UA clear     Glucose, UA neg     Bilirubin, UA neg     Ketones, UA neg     Spec Grav, UA 1.010     Blood, UA neg     pH, UA 7.0     Protein, UA neg     Urobilinogen, UA 0.2     Nitrite, UA neg     Leukocytes, UA Negative      Assessment & Plan:  Elevated blood pressure - Plan: POCT CBC, Comprehensive metabolic panel, TSH, POCT urinalysis dipstick, EKG 12-Lead  Hypertensive urgency  Essential hypertension, benign - Plan: chlorthalidone (HYGROTON) 25 MG tablet  Tobacco abuse  Macrocytosis - Plan: Vitamin B12, Folate  Anemia  Meds ordered this encounter  Medications  . chlorthalidone (HYGROTON) 25 MG tablet    Sig: Take 1 tablet (25 mg total) by mouth daily.    Dispense:  30 tablet    Refill:  1  . ibuprofen (ADVIL,MOTRIN) 200 MG tablet    Sig: Take 200 mg by mouth every 6 (six) hours as needed for pain.

## 2013-05-20 NOTE — Patient Instructions (Addendum)
Make sure in 1-2 weeks you follow up here with me or with your PCP Dr. Margo Aye to have your blood pressure rechecked, have your potassium and your kidneys rechecked, make sure you are tolerating the new blood pressure medication, and have your cholesterol checked (so be fasting for at least 6 hours before your visit - morning time appt with no food before is best.)  Managing Your High Blood Pressure Blood pressure is a measurement of how forceful your blood is pressing against the walls of the arteries. Arteries are muscular tubes within the circulatory system. Blood pressure does not stay the same. Blood pressure rises when you are active, excited, or nervous; and it lowers during sleep and relaxation. If the numbers measuring your blood pressure stay above normal most of the time, you are at risk for health problems. High blood pressure (hypertension) is a long-term (chronic) condition in which blood pressure is elevated. A blood pressure reading is recorded as two numbers, such as 120 over 80 (or 120/80). The first, higher number is called the systolic pressure. It is a measure of the pressure in your arteries as the heart beats. The second, lower number is called the diastolic pressure. It is a measure of the pressure in your arteries as the heart relaxes between beats.  Keeping your blood pressure in a normal range is important to your overall health and prevention of health problems, such as heart disease and stroke. When your blood pressure is uncontrolled, your heart has to work harder than normal. High blood pressure is a very common condition in adults because blood pressure tends to rise with age. Men and women are equally likely to have hypertension but at different times in life. Before age 101, men are more likely to have hypertension. After 51 years of age, women are more likely to have it. Hypertension is especially common in African Americans. This condition often has no signs or symptoms. The  cause of the condition is usually not known. Your caregiver can help you come up with a plan to keep your blood pressure in a normal, healthy range. BLOOD PRESSURE STAGES Blood pressure is classified into four stages: normal, prehypertension, stage 1, and stage 2. Your blood pressure reading will be used to determine what type of treatment, if any, is necessary. Appropriate treatment options are tied to these four stages:  Normal  Systolic pressure (mm Hg): below 120.  Diastolic pressure (mm Hg): below 80. Prehypertension  Systolic pressure (mm Hg): 120 to 139.  Diastolic pressure (mm Hg): 80 to 89. Stage1  Systolic pressure (mm Hg): 140 to 159.  Diastolic pressure (mm Hg): 90 to 99. Stage2  Systolic pressure (mm Hg): 160 or above.  Diastolic pressure (mm Hg): 100 or above. RISKS RELATED TO HIGH BLOOD PRESSURE Managing your blood pressure is an important responsibility. Uncontrolled high blood pressure can lead to:  A heart attack.  A stroke.  A weakened blood vessel (aneurysm).  Heart failure.  Kidney damage.  Eye damage.  Metabolic syndrome.  Memory and concentration problems. HOW TO MANAGE YOUR BLOOD PRESSURE Blood pressure can be managed effectively with lifestyle changes and medicines (if needed). Your caregiver will help you come up with a plan to bring your blood pressure within a normal range. Your plan should include the following: Education  Read all information provided by your caregivers about how to control blood pressure.  Educate yourself on the latest guidelines and treatment recommendations. New research is always being done to further  define the risks and treatments for high blood pressure. Lifestylechanges  Control your weight.  Avoid smoking.  Stay physically active.  Reduce the amount of salt in your diet.  Reduce stress.  Control any chronic conditions, such as high cholesterol or diabetes.  Reduce your alcohol  intake. Medicines  Several medicines (antihypertensive medicines) are available, if needed, to bring blood pressure within a normal range. Communication  Review all the medicines you take with your caregiver because there may be side effects or interactions.  Talk with your caregiver about your diet, exercise habits, and other lifestyle factors that may be contributing to high blood pressure.  See your caregiver regularly. Your caregiver can help you create and adjust your plan for managing high blood pressure. RECOMMENDATIONS FOR TREATMENT AND FOLLOW-UP  The following recommendations are based on current guidelines for managing high blood pressure in nonpregnant adults. Use these recommendations to identify the proper follow-up period or treatment option based on your blood pressure reading. You can discuss these options with your caregiver.  Systolic pressure of 120 to 139 or diastolic pressure of 80 to 89: Follow up with your caregiver as directed.  Systolic pressure of 140 to 160 or diastolic pressure of 90 to 100: Follow up with your caregiver within 2 months.  Systolic pressure above 160 or diastolic pressure above 100: Follow up with your caregiver within 1 month.  Systolic pressure above 180 or diastolic pressure above 110: Consider antihypertensive therapy; follow up with your caregiver within 1 week.  Systolic pressure above 200 or diastolic pressure above 120: Begin antihypertensive therapy; follow up with your caregiver within 1 week. Document Released: 07/26/2012 Document Reviewed: 07/26/2012 [facility] Patient Information 2014 [place], [place].    Potassium Content of Foods Potassium is a mineral found in many foods and drinks. It helps keep fluids and minerals balanced in your body and also affects how steadily your heart beats. The body needs potassium to control blood pressure and to keep the muscles and nervous system healthy. However, certain health conditions and  medicine may require you to eat more or less potassium-rich foods and drinks. Your caregiver or dietitian will tell you how much potassium you should have each day. COMMON SERVING SIZES The list below tells you how big or small common portion sizes are:  1 oz.........4 stacked dice.  3 oz........Marland KitchenDeck of cards.  1 tsp.......Marland KitchenTip of little finger.  1 tbsp....Marland KitchenMarland KitchenThumb.  2 tbsp....Marland KitchenMarland KitchenGolf ball.   c..........Marland KitchenHalf of a fist.  1 c...........Marland KitchenA fist. FOODS AND DRINKS HIGH IN POTASSIUM More than 200 mg of potassium per serving. A serving size is  c (120 mL or noted gram weight) unless otherwise stated. While all the items on this list are high in potassium, some items are higher in potassium than others. Fruits  Apricots (sliced), 83 g.  Apricots (dried halves), 3 oz / 24 g.  Avocado (cubed),  c / 50 g.  Banana (sliced), 75 g.  Cantaloupe (cubed), 80 g.  Dates (pitted), 5 whole / 35 g.  Figs (dried), 4 whole / 32 g.  Guava, c / 55 g.  Honeydew, 1 wedge / 85 g.  Kiwi (sliced), 90 g.  Nectarine, 1 small / 129 g.  Orange, 1 medium / 131 g.  Orange juice.  Pomegranate seeds, 87 g.  Pomegranate juice.  Prunes (pitted), 3 whole / 30 g.  Prune juice, 3 oz / 90 mL.  Seedless raisins, 3 tbsp / 27 g. Vegetables  Artichoke,  of a medium / 64  g.  Asparagus (boiled), 90 g.  Baked beans,  c / 63 g.  Bamboo shoots,  c / 38 g.  Beets (cooked slices), 85 g.  Broccoli (boiled), 78 g.  Brussels sprout (boiled), 78 g.  Butternut squash (baked), 103 g.  Chickpea (cooked), 82 g.  Green peas (cooked), 80 g.  Hubbard squash (baked cubes),  c / 68 g.  Kidney beans (cooked), 5 tbsp / 55 g.  Lima beans (cooked),  c / 43 g.  Navy beans (cooked),  c / 61 g.  Potato (baked), 61 g.  Potato (boiled), 78 g.  Pumpkin (boiled), 123 g.  Refried beans,  c / 79 g.  Spinach (cooked),  c / 45 g.  Split peas (cooked),  c / 65 g.  Sun-dried tomatoes, 2 tbsp / 7  g.  Sweet potato (baked),  c / 50 g.  Tomato (chopped or sliced), 90 g.  Tomato juice.  Tomato paste, 4 tsp / 21 g.  Tomato sauce,  c / 61 g.  Vegetable juice.  White mushrooms (cooked), 78 g.  Yam (cooked or baked),  c / 34 g.  Zucchini squash (boiled), 90 g. Other Foods and Drinks  Almonds (whole),  c / 36 g.  Cashews (oil roasted),  c / 32 g.  Chocolate milk.  Chocolate pudding, 142 g.  Clams (steamed), 1.5 oz / 43 g.  Dark chocolate, 1.5 oz / 42 g.  Fish, 3 oz / 85 g.  King crab (steamed), 3 oz / 85 g.  Lobster (steamed), 4 oz / 113 g.  Milk (skim, 1%, 2%, whole), 1 c / 240 mL.  Milk chocolate, 2.3 oz / 66 g.  Milk shake.  Nonfat fruit variety yogurt, 123 g.  Peanuts (oil roasted), 1 oz / 28 g.  Peanut butter, 2 tbsp / 32 g.  Pistachio nuts, 1 oz / 28 g.  Pumpkin seeds, 1 oz / 28 g.  Red meat (broiled, cooked, grilled), 3 oz / 85 g.  Scallops (steamed), 3 oz / 85 g.  Shredded wheat cereal (dry), 3 oblong biscuits / 75 g.  Spaghetti sauce,  c / 66 g.  Sunflower seeds (dry roasted), 1 oz / 28 g.  Veggie burger, 1 patty / 70 g. FOODS MODERATE IN POTASSIUM Between 150 mg and 200 mg per serving. A serving is  c (120 mL or noted gram weight) unless otherwise stated. Fruits  Grapefruit,  of the fruit / 123 g.  Grapefruit juice.  Pineapple juice.  Plums (sliced), 83 g.  Tangerine, 1 large / 120 g. Vegetables  Carrots (boiled), 78 g.  Carrots (sliced), 61 g.  Rhubarb (cooked with sugar), 120 g.  [place]aga (cooked), 120 g.  Sweet corn (cooked), 75 g.  Yellow snap beans (cooked), 63 g. Other Foods and Drinks   Bagel, 1 bagel / 98 g.  Chicken breast (roasted and chopped),  c / 70 g.  Chocolate ice cream / 66 g.  Pita bread, 1 large / 64 g.  Shrimp (steamed), 4 oz / 113 g.  Swiss cheese (diced), 70 g.  Vanilla ice cream, 66 g.  Vanilla pudding, 140 g. FOODS LOW IN POTASSIUM Less than 150 mg per serving. A  serving size is  cup (120 mL or noted gram weight) unless otherwise stated. If you eat more than 1 serving of a food low in potassium, the food may be considered a food high in potassium. Fruits  Apple (slices), 55 g.  Apple juice.  Applesauce, 122 g.  Blackberries, 72 g.  Blueberries, 74 g.  Cranberries, 50 g.  Cranberry juice.  Fruit cocktail, 119 g.  Fruit punch.  Grapes, 46 g.  Grape juice.  Mandarin oranges (canned), 126 g.  Peach (slices), 77 g.  Pineapple (chunks), 83 g.  Raspberries, 62 g.  Red cherries (without pits), 78 g.  Strawberries (sliced), 83 g.  Watermelon (diced), 76 g. Vegetables  Alfalfa sprouts, 17 g.  Bell peppers (sliced), 46 g.  Cabbage (shredded), 35 g.  Cauliflower (boiled), 62 g.  Celery, 51 g.  Collard greens (boiled), 95 g.  Cucumber (sliced), 52 g.  Eggplant (cubed), 41 g.  Green beans (boiled), 63 g.  Lettuce (shredded), 1 c / 36 g.  Onions (sauteed), 44 g.  Radishes (sliced), 58 g.  Spaghetti squash, 51 g. Other Foods and Drinks  [employer], 1 slice / 28 g.  Black tea.  Brown rice (cooked), 98 g.  Butter croissant, 1 medium / 57 g.  Carbonated soda.  Coffee.  Cheddar cheese (diced), 66 g.  Corn flake cereal (dry), 14 g.  Cottage cheese, 118 g.  Cream of rice cereal (cooked), 122 g.  Cream of wheat cereal (cooked), 126 g.  Crisped rice cereal (dry), 14 g.  Egg (boiled, fried, poached, omelet, scrambled), 1 large / 46 61 g.  English muffin, 1 muffin / 57 g.  Frozen ice pop, 1 pop / 55 g.  Graham cracker, 1 large rectangular cracker / 14 g.  Jelly beans, 112 g.  Non-dairy whipped topping.  Oatmeal, 88 g.  Orange sherbet, 74 g.  Puffed rice cereal (dry), 7 g.  Pasta (cooked), 70 g.  Rice cakes, 4 cakes / 36 g.  Sugared doughnut, 4 oz / 116 g.  White bread, 1 slice / 30 g.  White rice (cooked), 79 93 g.  Wild rice (cooked), 82 g.  Yellow cake, 1 slice / 68  g. Document Released: 06/15/2005 Document Revised: 10/18/2012 Document Reviewed: 03/17/2012 [facility] Patient Information 2014 [place], [place]. DASH Diet The DASH diet stands for "Dietary Approaches to Stop Hypertension." It is a healthy eating plan that has been shown to reduce high blood pressure (hypertension) in as little as 14 days, while also possibly providing other significant health benefits. These other health benefits include reducing the risk of breast cancer after menopause and reducing the risk of type 2 diabetes, heart disease, colon cancer, and stroke. Health benefits also include weight loss and slowing kidney failure in patients with chronic kidney disease.  DIET GUIDELINES  Limit salt (sodium). Your diet should contain less than 1500 mg of sodium daily.  Limit refined or processed carbohydrates. Your diet should include mostly whole grains. Desserts and added sugars should be used sparingly.  Include small amounts of heart-healthy fats. These types of fats include nuts, oils, and tub margarine. Limit saturated and trans fats. These fats have been shown to be harmful in the body. CHOOSING FOODS  The following food groups are based on a 2000 calorie diet. See your Registered Dietitian for individual calorie needs. Grains and Grain Products (6 to 8 servings daily)  Eat More Often: Whole-wheat bread, brown rice, whole-grain or wheat pasta, quinoa, popcorn without added fat or salt (air popped).  Eat Less Often: White bread, white pasta, white rice, cornbread. Vegetables (4 to 5 servings daily)  Eat More Often: Fresh, frozen, and canned vegetables. Vegetables may be raw, steamed, roasted, or grilled with a minimal amount of fat.  Eat Less  Often/Avoid: Creamed or fried vegetables. Vegetables in a cheese sauce. Fruit (4 to 5 servings daily)  Eat More Often: All fresh, canned (in natural juice), or frozen fruits. Dried fruits without added sugar. One hundred percent fruit juice (  cup [237 mL] daily).  Eat Less Often: Dried fruits with added sugar. Canned fruit in light or heavy syrup. [employer], Fish, and Poultry (2 servings or less daily. One serving is 3 to 4 oz [85-114 g]).  Eat More Often: Ninety percent or leaner ground beef, tenderloin, sirloin. Round cuts of beef, chicken breast, [place] breast. All fish. Grill, bake, or broil your meat. Nothing should be fried.  Eat Less Often/Avoid: Fatty cuts of meat, [place], or chicken leg, thigh, or wing. Fried cuts of meat or fish. Dairy (2 to 3 servings)  Eat More Often: Low-fat or fat-free milk, low-fat plain or light yogurt, reduced-fat or part-skim cheese.  Eat Less Often/Avoid: Milk (whole, 2%).Whole milk yogurt. Full-fat cheeses. Nuts, Seeds, and Legumes (4 to 5 servings per week)  Eat More Often: All without added salt.  Eat Less Often/Avoid: Salted nuts and seeds, canned beans with added salt. Fats and Sweets (limited)  Eat More Often: Vegetable oils, tub margarines without trans fats, sugar-free gelatin. Mayonnaise and salad dressings.  Eat Less Often/Avoid: Coconut oils, palm oils, butter, stick margarine, cream, half and half, cookies, candy, pie. FOR MORE INFORMATION The Dash Diet Eating Plan: www.dashdiet.org Document Released: 10/21/2011 Document Revised: 01/24/2012 Document Reviewed: 10/21/2011 [facility] Patient Information 2014 [place], [place]. Smoking Cessation, Tips for Success YOU CAN QUIT SMOKING If you are ready to quit smoking, congratulations! You have chosen to help yourself be healthier. Cigarettes bring nicotine, tar, carbon monoxide, and other irritants into your body. Your lungs, heart, and blood vessels will be able to work better without these poisons. There are many different ways to quit smoking. Nicotine gum, nicotine patches, a nicotine inhaler, or nicotine nasal spray can help with physical craving. Hypnosis, support groups, and medicines help break the habit of smoking. Here are  some tips to help you quit for good.  Throw away all cigarettes.  Clean and remove all ashtrays from your home, work, and car.  On a card, write down your reasons for quitting. Carry the card with you and read it when you get the urge to smoke.  Cleanse your body of nicotine. Drink enough water and fluids to keep your urine clear or pale yellow. Do this after quitting to flush the nicotine from your body.  Learn to predict your moods. Do not let a bad situation be your excuse to have a cigarette. Some situations in your life might tempt you into wanting a cigarette.  Never have "just one" cigarette. It leads to wanting another and another. Remind yourself of your decision to quit.  Change habits associated with smoking. If you smoked while driving or when feeling stressed, try other activities to replace smoking. Stand up when drinking your coffee. Brush your teeth after eating. Sit in a different chair when you read the paper. Avoid alcohol while trying to quit, and try to drink fewer caffeinated beverages. Alcohol and caffeine may urge you to smoke.  Avoid foods and drinks that can trigger a desire to smoke, such as sugary or spicy foods and alcohol.  Ask people who smoke not to smoke around you.  Have something planned to do right after eating or having a cup of coffee. Take a walk or exercise to perk you up. This will  help to keep you from overeating.  Try a relaxation exercise to calm you down and decrease your stress. Remember, you may be tense and nervous for the first 2 weeks after you quit, but this will pass.  Find new activities to keep your hands busy. Play with a pen, coin, or rubber band. Doodle or draw things on paper.  Brush your teeth right after eating. This will help cut down on the craving for the taste of tobacco after meals. You can try mouthwash, too.  Use oral substitutes, such as lemon drops, carrots, a cinnamon stick, or chewing gum, in place of cigarettes. Keep  them handy so they are available when you have the urge to smoke.  When you have the urge to smoke, try deep breathing.  Designate your home as a nonsmoking area.  If you are a heavy smoker, ask your caregiver about a prescription for nicotine chewing gum. It can ease your withdrawal from nicotine.  Reward yourself. Set aside the cigarette money you save and buy yourself something nice.  Look for support from others. Join a support group or smoking cessation program. Ask someone at home or at work to help you with your plan to quit smoking.  Always ask yourself, "Do I need this cigarette or is this just a reflex?" Tell yourself, "Today, I choose not to smoke," or "I do not want to smoke." You are reminding yourself of your decision to quit, even if you do smoke a cigarette. HOW WILL I FEEL WHEN I QUIT SMOKING?  The benefits of not smoking start within days of quitting.  You may have symptoms of withdrawal because your body is used to nicotine (the addictive substance in cigarettes). You may crave cigarettes, be irritable, feel very hungry, cough often, get headaches, or have difficulty concentrating.  The withdrawal symptoms are only temporary. They are strongest when you first quit but will go away within 10 to 14 days.  When withdrawal symptoms occur, stay in control. Think about your reasons for quitting. Remind yourself that these are signs that your body is healing and getting used to being without cigarettes.  Remember that withdrawal symptoms are easier to treat than the major diseases that smoking can cause.  Even after the withdrawal is over, expect periodic urges to smoke. However, these cravings are generally short-lived and will go away whether you smoke or not. Do not smoke!  If you relapse and smoke again, do not lose hope. Most smokers quit 3 times before they are successful.  If you relapse, do not give up! Plan ahead and think about what you will do the next time you get  the urge to smoke. LIFE AS A NONSMOKER: MAKE IT FOR A MONTH, MAKE IT FOR LIFE Day 1: Hang this page where you will see it every day. Day 2: Get rid of all ashtrays, matches, and lighters. Day 3: Drink water. Breathe deeply between sips. Day 4: Avoid places with smoke-filled air, such as bars, clubs, or the smoking section of restaurants. Day 5: Keep track of how much money you save by not smoking. Day 6: Avoid boredom. Keep a good book with you or go to the movies. Day 7: Reward yourself! One week without smoking! Day 8: Make a dental appointment to get your teeth cleaned. Day 9: Decide how you will turn down a cigarette before it is offered to you. Day 10: Review your reasons for quitting. Day 11: Distract yourself. Stay active to keep your mind  off smoking and to relieve tension. Take a walk, exercise, read a book, do a crossword puzzle, or try a new hobby. Day 12: Exercise. Get off the bus before your stop or use stairs instead of escalators. Day 13: Call on friends for support and encouragement. Day 14: Reward yourself! Two weeks without smoking! Day 15: Practice deep breathing exercises. Day 16: Bet a friend that you can stay a nonsmoker. Day 17: Ask to sit in nonsmoking sections of restaurants. Day 18: Hang up "No Smoking" signs. Day 19: Think of yourself as a nonsmoker. Day 20: Each morning, tell yourself you will not smoke. Day 21: Reward yourself! Three weeks without smoking! Day 22: Think of smoking in negative ways. Remember how it stains your teeth, gives you bad breath, and leaves you short of breath. Day 23: Eat a nutritious breakfast. Day 24:Do not relive your days as a smoker. Day 25: Hold a pencil in your hand when talking on the telephone. Day 26: Tell all your friends you do not smoke. Day 27: Think about how much better food tastes. Day 28: Remember, one cigarette is one too many. Day 29: Take up a hobby that will keep your hands busy. Day 30: Congratulations! One  month without smoking! Give yourself a big reward. Your caregiver can direct you to community resources or hospitals for support, which may include:  Group support.  Education.  Hypnosis.  Subliminal therapy. Document Released: 07/30/2004 Document Revised: 01/24/2012 Document Reviewed: 08/18/2009 [facility] Patient Information 2014 [place], [place].

## 2013-05-22 ENCOUNTER — Telehealth: Payer: Self-pay

## 2013-05-22 NOTE — Telephone Encounter (Signed)
PT STATES SHE HAD SEEN DR Clelia Croft AND GIVEN BP MEDS. THE 1ST DAY HER PRESSURE WAS GOOD, BUT THEN IT STARTED BEING 154/90 AND 167/97. DIDN'T KNOW IF THE MEDICINE IS HELPING LIKE IT SHOULD. PLEASE CALL [phone]

## 2013-05-23 ENCOUNTER — Other Ambulatory Visit: Payer: Self-pay | Admitting: Family Medicine

## 2013-05-23 DIAGNOSIS — I10 Essential (primary) hypertension: Secondary | ICD-10-CM

## 2013-05-23 MED ORDER — LISINOPRIL 20 MG PO TABS: 20.0000 mg | ORAL_TABLET | Freq: Every day | ORAL | Status: DC

## 2013-05-23 NOTE — Telephone Encounter (Signed)
With how high her blood pressure was, it is likely that she will need multiple medications.  It is unlikely that any one medication will control her BP which is why she needed to come back in for a recheck in a week.  In the meantime, we can call an additional BP med into her pharmacy - we will try lisinopril.  She should take this - IN ADDITION - to the chlorthalidone daily and make sure to come in in a week for a recheck.

## 2013-05-23 NOTE — Telephone Encounter (Signed)
Thanks I called patient, she states she saw her PCP today and he changed to something else. I tried to d/c the lisinopril, but could not take out of her list, for some reason, the computer is telling me I do not have security to do this ( although this is something I do daily) she was advised to take what her PCP has given, and she states she will follow up there.

## 2013-05-24 ENCOUNTER — Other Ambulatory Visit: Payer: Self-pay | Admitting: Family Medicine

## 2013-05-24 NOTE — Telephone Encounter (Signed)
Meds discontinued

## 2013-06-21 ENCOUNTER — Ambulatory Visit (HOSPITAL_COMMUNITY)
Admission: RE | Admit: 2013-06-21 | Discharge: 2013-06-21 | Disposition: A | Discharge status: Home / Self Care | Payer: 59 | Source: Ambulatory Visit | Attending: Internal Medicine | Admitting: Internal Medicine

## 2013-06-21 ENCOUNTER — Other Ambulatory Visit (HOSPITAL_COMMUNITY): Payer: Self-pay | Admitting: Internal Medicine

## 2013-06-21 DIAGNOSIS — R0602 Shortness of breath: Secondary | ICD-10-CM | POA: Insufficient documentation

## 2013-06-21 DIAGNOSIS — J449 Chronic obstructive pulmonary disease, unspecified: Secondary | ICD-10-CM | POA: Insufficient documentation

## 2013-06-21 DIAGNOSIS — R0789 Other chest pain: Secondary | ICD-10-CM | POA: Insufficient documentation

## 2013-06-21 DIAGNOSIS — J4489 Other specified chronic obstructive pulmonary disease: Secondary | ICD-10-CM | POA: Insufficient documentation

## 2013-07-10 ENCOUNTER — Ambulatory Visit: Payer: BC Managed Care – PPO | Admitting: Cardiovascular Disease

## 2013-07-23 ENCOUNTER — Encounter: Payer: Self-pay | Admitting: *Deleted

## 2013-07-23 DIAGNOSIS — R42 Dizziness and giddiness: Secondary | ICD-10-CM | POA: Insufficient documentation

## 2013-07-23 DIAGNOSIS — E871 Hypo-osmolality and hyponatremia: Secondary | ICD-10-CM | POA: Insufficient documentation

## 2013-07-24 ENCOUNTER — Ambulatory Visit (INDEPENDENT_AMBULATORY_CARE_PROVIDER_SITE_OTHER): Payer: 59 | Admitting: Cardiology

## 2013-07-24 ENCOUNTER — Encounter: Payer: Self-pay | Admitting: Cardiology

## 2013-07-24 ENCOUNTER — Encounter: Payer: Self-pay | Admitting: *Deleted

## 2013-07-24 VITALS — BP 138/85 | HR 88 | Ht 63.0 in | Wt 129.2 lb

## 2013-07-24 DIAGNOSIS — I10 Essential (primary) hypertension: Secondary | ICD-10-CM

## 2013-07-24 DIAGNOSIS — R002 Palpitations: Secondary | ICD-10-CM

## 2013-07-24 DIAGNOSIS — R072 Precordial pain: Secondary | ICD-10-CM | POA: Insufficient documentation

## 2013-07-24 NOTE — Assessment & Plan Note (Signed)
She reports fairly long-standing history. Developed significant hyponatremia on chlorthalidone which has now been discontinued. She is followed by Dr. Margo Aye on [place] at this point. Discussed regular exercise regimen, smoking cessation, low-salt diet. Would keep close followup Dr. Margo Aye for further medication adjustments and to ensure that the electrolyte abnormalities improve to baseline. If not, she might need further workup for secondary causes of hypertension.

## 2013-07-24 NOTE — Assessment & Plan Note (Signed)
Exertional, now resolved. Baseline ECG is normal. She has a history of hypertension, also long-standing tobacco use, no other major cardiac risk factors with recently documented optimal LDL. She has never undergone any ischemic evaluation, and is referred for stress testing. We will obtain an exercise echocardiogram and inform her of the results.

## 2013-07-24 NOTE — Progress Notes (Signed)
Clinical Summary Ms. Darlene [name] is a 52 y.o.female referred for cardiology consultation by Dr. Margo Aye. She provides a history of hypertension, medication intolerances over time. She has most recently been treated with chlorthalidone, then the addition of Edarbi. On this combination she began to experience dizziness, exertional chest pain, and was ultimately found to be significantly hyponatremic. Dr. Margo Aye has taken her off chlorthalidone. Sodium levels are improving based on review of lab work. She also states that her chest pain symptoms have improved. Her home blood pressure checks show systolics from 104 up into the 150s.   Recent lab work showed sodium 122, potassium 4.6, chloride 88, BUN 10, creatinine 0.5, AST 25, ALT 22, hemoglobin 12.6, platelets 322, cholesterol 149, triglycerides 78, HDL 54, LDL 79 hemoglobin U9W 5.7, TSH 1.6.  ECG today shows sinus rhythm. She reports no typical exertional chest pain. Does not exercise regularly. States that she eats a low sodium diet. Has not been overweight. She does have a long-standing history of tobacco use.   Allergies  Allergen Reactions  . Chlorthalidone     Dizzy spells and electrolyte abnormalities  . Prednisone     Stroke like symptoms   . Bee Venom Swelling and Rash    Current Outpatient Prescriptions  Medication Sig Dispense Refill  . acetaminophen (TYLENOL) 500 MG tablet Take 500 mg by mouth as needed. For pain       . Ascorbic Acid (VITAMIN C) 1000 MG tablet Take 3,000 mg by mouth daily.        . Azilsartan Medoxomil (EDARBI) 80 MG TABS Take 1 tablet by mouth daily.      . diphenhydrAMINE (BENADRYL) 25 mg capsule Take 25 mg by mouth 2 (two) times daily as needed. For allergies       . ibuprofen (ADVIL,MOTRIN) 200 MG tablet Take 200 mg by mouth every 6 (six) hours as needed for pain.       No current facility-administered medications for this visit.    Past Medical History  Diagnosis Date  . Essential hypertension, benign   .  Thyroid nodule   . History of vertigo   . Impaired fasting glucose     Past Surgical History  Procedure Laterality Date  . Abdominal hysterectomy    . Left hand surgery    . Cholecystectomy open    . Placement of breast implants      Family History  Problem Relation Age of Onset  . Hypertension Father   . Cancer Mother   . Cancer Father     Social History Ms. Angevine reports that she has been smoking Cigarettes.  She has a 35 pack-year smoking history. She does not have any smokeless tobacco history on file. Ms. Kiger reports that she does not drink alcohol.  Review of Systems Had some in her mid palpitations, now resolved. No syncope. Distant exertion has also resolved. Otherwise negative.  Physical Examination Filed Vitals:   07/24/13 0852  BP: 138/85  Pulse: 88   Filed Weights   07/24/13 0852  Weight: 129 lb 4 oz (58.627 kg)   Appears comfortable at rest. HEENT: Conjunctiva and lids normal, oropharynx clear. Neck: Supple, no elevated JVP or carotid bruits, no thyromegaly. Lungs: Clear to auscultation, nonlabored breathing at rest. Cardiac: Regular rate and rhythm, no S3 or significant systolic murmur, no pericardial rub. Abdomen: Soft, nontender, bowel sounds present. Extremities: No pitting edema, distal pulses 2+. Skin: Warm and dry. Musculoskeletal: No kyphosis. Neuropsychiatric: Alert and oriented x3, affect grossly appropriate.  Problem List and Plan   Precordial pain Exertional, now resolved. Baseline ECG is normal. She has a history of hypertension, also long-standing tobacco use, no other major cardiac risk factors with recently documented optimal LDL. She has never undergone any ischemic evaluation, and is referred for stress testing. We will obtain an exercise echocardiogram and inform her of the results.  Essential hypertension, benign She reports fairly long-standing history. Developed significant hyponatremia on chlorthalidone which has now been  discontinued. She is followed by Dr. Margo Aye on Adarbi at this point. Discussed regular exercise regimen, smoking cessation, low-salt diet. Would keep close followup Dr. Margo Aye for further medication adjustments and to ensure that the electrolyte abnormalities improve to baseline. If not, she might need further workup for secondary causes of hypertension.    Jonelle Sidle, M.D., F.A.C.C.

## 2013-07-24 NOTE — Patient Instructions (Addendum)
Your physician recommends that you schedule a follow-up appointment in: TO BE DETERMINED  Your physician has requested that you have a stress echocardiogram. For further information please visit [email or web address]. Please follow instruction sheet as given.WE WILL CALL YOU WITH THE RESULTS 

## 2013-08-21 ENCOUNTER — Ambulatory Visit (HOSPITAL_COMMUNITY): Admission: RE | Admit: 2013-08-21 | Payer: 59 | Source: Ambulatory Visit

## 2013-09-20 ENCOUNTER — Other Ambulatory Visit: Payer: Self-pay

## 2013-10-18 ENCOUNTER — Encounter: Payer: Self-pay | Admitting: *Deleted

## 2013-10-25 ENCOUNTER — Telehealth: Payer: Self-pay | Admitting: *Deleted

## 2013-10-25 NOTE — Telephone Encounter (Signed)
Mailed pt certified letter per unable to contact via telephone to reschedule tests/apt, received receipt [phone]

## 2014-08-30 ENCOUNTER — Other Ambulatory Visit: Payer: Self-pay

## 2016-09-28 NOTE — Progress Notes (Signed)
Cardiology Office Note   Date:  09/29/2016   ID:  Darlene Guttingina M Counihan, DOB 02-17-1961, MRN [id number]  PCP:  Dwana MelenaZack Hall, MD  Cardiologist:   Charlton HawsPeter Cayson Kalb, MD   No chief complaint on file.     History of Present Illness: Darlene [name] is a [age] y.o. female who presents for evaluation of chest pain. CRF;s HTN. And impaired Fasting glucose without Dx DM>  Note by Dr Margo AyeHall 11/13 indicated abnormal ECG However attached ECG to my review is normal in sinus rate 90 done 09/25/16  And no different than ECG done 07/24/13 in Epic Seen at that time by Dr Diona BrownerMcDowell 2014  for chest pain and HTN. Noted low sodium on diuretic Stress echo ordered But does not appear to have ever been done   She indicates multiple BP meds didn't agree with her in past but I only note diuretic Feels heart beating hard a lot. Not necessarily fast Pain in chest atypical Sharp can last all day points to entire sub mammary area on both sides  Smokes 1 ppd Bronchitic     Past Medical History:  Diagnosis Date  . Essential hypertension, benign   . History of vertigo   . Impaired fasting glucose   . Thyroid nodule     Past Surgical History:  Procedure Laterality Date  . ABDOMINAL HYSTERECTOMY    . CHOLECYSTECTOMY OPEN    . Left hand surgery    . PLACEMENT OF BREAST IMPLANTS       Current Outpatient Prescriptions  Medication Sig Dispense Refill  . acetaminophen (TYLENOL) 500 MG tablet Take 500 mg by mouth as needed. For pain     . amLODipine (NORVASC) 10 MG tablet Take 5 mg by mouth daily.    . Ascorbic Acid (VITAMIN C) 1000 MG tablet Take 3,000 mg by mouth daily.      Marland Kitchen. HYDROcodone-acetaminophen (NORCO/VICODIN) 5-325 MG tablet Take 5-325 tablets by mouth 3 (three) times daily.    Marland Kitchen. ibuprofen (ADVIL,MOTRIN) 200 MG tablet Take 200 mg by mouth every 6 (six) hours as needed for pain.    . diphenhydrAMINE (BENADRYL) 25 mg capsule Take 25 mg by mouth 2 (two) times daily as needed. For allergies      No current  facility-administered medications for this visit.     Allergies:   Chlorthalidone; Prednisone; and Bee venom    Social History:  The patient  reports that she has been smoking Cigarettes.  She has a 35.00 pack-year smoking history. She has never used smokeless tobacco. She reports that she uses drugs, including Hydromorphone. She reports that she does not drink alcohol.   Family History:  The patient's family history includes Cancer in her father and mother; Hypertension in her father.    ROS:  Please see the history of present illness.   Otherwise, review of systems are positive for none.   All other systems are reviewed and negative.    PHYSICAL EXAM: VS:  BP (!) 148/82   Pulse 87   Ht 5\' 3"  (1.6 m)   Wt 56.7 kg (125 lb)   SpO2 98%   BMI 22.14 kg/m  , BMI Body mass index is 22.14 kg/m. Affect appropriate Healthy:  appears stated age HEENT: normal Neck supple with no adenopathy JVP normal no bruits no thyromegaly Lungs clear with no wheezing and good diaphragmatic motion Heart:  S1/S2 no murmur, no rub, gallop or click PMI normal Abdomen: benighn, BS positve, no tenderness, no AAA no  bruit.  No HSM or HJR Distal pulses intact with no bruits No edema Neuro non-focal Skin warm and dry No muscular weakness    EKG: NSR normal    Recent Labs: No results found for requested labs within last 8760 hours.    Lipid Panel No results found for: CHOL, TRIG, HDL, CHOLHDL, VLDL, LDLCALC, LDLDIRECT    Wt Readings from Last 3 Encounters:  09/29/16 56.7 kg (125 lb)  07/24/13 58.6 kg (129 lb 4 oz)  05/20/13 58.4 kg (128 lb 12.8 oz)      Other studies Reviewed: Additional studies/ records that were reviewed today include: Notes SM and primary ECG x 2 .    ASSESSMENT AND PLAN:  1.  Chest pain atypical ECG normal f/u stress myovue 2. HTN  Consider adding beta blocker in future amlodipine started a couple of weeks ago BP improved 3. Palpitations: benign sounding event  monitor  4. Smoking counseled on cessation and relationship to BP and palpitations with nicotine    Current medicines are reviewed at length with the patient today.  The patient does not have concerns regarding medicines.  The following changes have been made:  no change  Labs/ tests ordered today include: Stress Myovue and Event Monitor   Orders Placed This Encounter  Procedures  . NM Myocar Multi W/Spect W/Wall Motion / EF  . Cardiac event monitor     Disposition:   FU with SM 3 months      Signed, Charlton HawsPeter Lyan Holck, MD  09/29/2016 10:42 AM    [facility] [address], [place], [place]  [address] Phone: [phone]; Fax: [phone]

## 2016-09-29 ENCOUNTER — Ambulatory Visit (INDEPENDENT_AMBULATORY_CARE_PROVIDER_SITE_OTHER): Payer: 59 | Admitting: Cardiovascular Disease

## 2016-09-29 ENCOUNTER — Encounter: Payer: Self-pay | Admitting: Cardiovascular Disease

## 2016-09-29 VITALS — BP 148/82 | HR 87 | Ht 63.0 in | Wt 125.0 lb

## 2016-09-29 DIAGNOSIS — R079 Chest pain, unspecified: Secondary | ICD-10-CM | POA: Diagnosis not present

## 2016-09-29 DIAGNOSIS — R002 Palpitations: Secondary | ICD-10-CM | POA: Diagnosis not present

## 2016-09-29 NOTE — Patient Instructions (Signed)
Medication Instructions:  Your physician recommends that you continue on your current medications as directed. Please refer to the Current Medication list given to you today.   Labwork: NONE  Testing/Procedures: Your physician has recommended that you wear an event monitor. Event monitors are medical devices that record the heart's electrical activity. Doctors most often us these monitors to diagnose arrhythmias. Arrhythmias are problems with the speed or rhythm of the heartbeat. The monitor is a small, portable device. You can wear one while you do your normal daily activities. This is usually used to diagnose what is causing palpitations/syncope (passing out).  Your physician has requested that you have en exercise stress myoview. For further information please visit [email or web address]. Please follow instruction sheet, as given.     Follow-Up: Your physician recommends that you schedule a follow-up appointment in:6-8 WEEKS   Any Other Special Instructions Will Be Listed Below (If Applicable).     If you need a refill on your cardiac medications before your next appointment, please call your pharmacy.

## 2016-10-04 ENCOUNTER — Ambulatory Visit (INDEPENDENT_AMBULATORY_CARE_PROVIDER_SITE_OTHER): Payer: 59

## 2016-10-04 DIAGNOSIS — R002 Palpitations: Secondary | ICD-10-CM | POA: Diagnosis not present

## 2016-10-12 ENCOUNTER — Encounter (HOSPITAL_COMMUNITY)
Admission: RE | Admit: 2016-10-12 | Discharge: 2016-10-12 | Disposition: A | Discharge status: Home / Self Care | Payer: 59 | Source: Ambulatory Visit | Attending: Cardiovascular Disease | Admitting: Cardiovascular Disease

## 2016-10-12 ENCOUNTER — Encounter (HOSPITAL_COMMUNITY): Admission: RE | Admit: 2016-10-12 | Payer: 59 | Source: Ambulatory Visit

## 2016-10-12 ENCOUNTER — Inpatient Hospital Stay (HOSPITAL_COMMUNITY): Admission: RE | Admit: 2016-10-12 | Payer: 59 | Source: Ambulatory Visit

## 2016-10-12 ENCOUNTER — Encounter (HOSPITAL_COMMUNITY): Payer: Self-pay

## 2016-10-12 DIAGNOSIS — R002 Palpitations: Secondary | ICD-10-CM | POA: Insufficient documentation

## 2016-10-12 DIAGNOSIS — R079 Chest pain, unspecified: Secondary | ICD-10-CM | POA: Insufficient documentation

## 2016-10-13 ENCOUNTER — Telehealth: Payer: Self-pay | Admitting: *Deleted

## 2016-10-13 ENCOUNTER — Encounter (HOSPITAL_COMMUNITY)
Admission: RE | Admit: 2016-10-13 | Discharge: 2016-10-13 | Disposition: A | Discharge status: Home / Self Care | Payer: 59 | Source: Ambulatory Visit | Attending: Cardiovascular Disease | Admitting: Cardiovascular Disease

## 2016-10-13 ENCOUNTER — Encounter (HOSPITAL_COMMUNITY): Payer: 59

## 2016-10-13 ENCOUNTER — Encounter (HOSPITAL_COMMUNITY): Payer: Self-pay

## 2016-10-13 DIAGNOSIS — R002 Palpitations: Secondary | ICD-10-CM | POA: Insufficient documentation

## 2016-10-13 DIAGNOSIS — R079 Chest pain, unspecified: Secondary | ICD-10-CM | POA: Insufficient documentation

## 2016-10-13 LAB — NM MYOCAR MULTI W/SPECT W/WALL MOTION / EF
CHL CUP NUCLEAR SRS: 0
CHL CUP NUCLEAR SSS: 2
CSEPED: 6 min
CSEPEW: 9.2 METS
Exercise duration (sec): 44 s
LHR: 0
LV dias vol: 46 mL (ref 46–106)
LV sys vol: 6 mL
MPHR: 166 {beats}/min
NUC STRESS TID: 0.98
Peak HR: 151 {beats}/min
Percent HR: 90 %
RPE: 15
Rest HR: 80 {beats}/min
SDS: 2

## 2016-10-13 MED ORDER — TECHNETIUM TC 99M TETROFOSMIN IV KIT
10.0000 | PACK | Freq: Once | INTRAVENOUS | Status: AC | PRN
  Administered 2016-10-13: 10.9 via INTRAVENOUS

## 2016-10-13 MED ORDER — TECHNETIUM TC 99M TETROFOSMIN IV KIT
30.0000 | PACK | Freq: Once | INTRAVENOUS | Status: AC | PRN
  Administered 2016-10-13: 31 via INTRAVENOUS

## 2016-10-13 MED ORDER — REGADENOSON 0.4 MG/5ML IV SOLN
INTRAVENOUS | Status: AC
  Filled 2016-10-13: qty 5

## 2016-10-13 MED ORDER — SODIUM CHLORIDE 0.9% FLUSH
INTRAVENOUS | Status: AC
  Administered 2016-10-13: 10 mL via INTRAVENOUS
  Filled 2016-10-13: qty 10

## 2016-10-13 NOTE — Telephone Encounter (Signed)
Called patient with test results. No answer. Unable to leave message.  

## 2016-10-13 NOTE — Telephone Encounter (Signed)
-----   Message from Wendall StadePeter C Nishan, MD sent at 10/13/2016  2:27 PM EST ----- Normal myovue study with no evidence of ischemia or infarction

## 2016-10-28 ENCOUNTER — Telehealth: Payer: Self-pay

## 2016-10-28 NOTE — Telephone Encounter (Signed)
Uploaded event monitor for Dr. Eden EmmsNishan to read.

## 2016-11-18 NOTE — Progress Notes (Deleted)
Cardiology Office Note   Date:  11/18/2016   ID:  Darlene [name], DOB [birth year], MRN [id number]  PCP:  Dwana Melena, MD  Cardiologist: Elzie Rings, NP   No chief complaint on file.     History of Present Illness: Darlene [name] is a 56 y.o. female who presents for ongoing assessment and management of hypertension, history of chest pain, with multiple coronary risk factors. She was last seen by Dr. Eden Emms on 09/29/2016. The patient was scheduled for a stress Myoview. She was counseled on smoking cessation.   Blood pressure demonstrated a hypertensive response to exercise.  There was no ST segment deviation noted during stress.  The study is normal.  This is a low risk study.  Nuclear stress EF: 88%.     Past Medical History:  Diagnosis Date  . Essential hypertension, benign   . History of vertigo   . Impaired fasting glucose   . Thyroid nodule     Past Surgical History:  Procedure Laterality Date  . ABDOMINAL HYSTERECTOMY    . CHOLECYSTECTOMY OPEN    . Left hand surgery    . PLACEMENT OF BREAST IMPLANTS       Current Outpatient Prescriptions  Medication Sig Dispense Refill  . acetaminophen (TYLENOL) 500 MG tablet Take 500 mg by mouth as needed. For pain     . amLODipine (NORVASC) 10 MG tablet Take 5 mg by mouth daily.    . Ascorbic Acid (VITAMIN C) 1000 MG tablet Take 3,000 mg by mouth daily.      . diphenhydrAMINE (BENADRYL) 25 mg capsule Take 25 mg by mouth 2 (two) times daily as needed. For allergies     . HYDROcodone-acetaminophen (NORCO/VICODIN) 5-325 MG tablet Take 5-325 tablets by mouth 3 (three) times daily.    Marland Kitchen ibuprofen (ADVIL,MOTRIN) 200 MG tablet Take 200 mg by mouth every 6 (six) hours as needed for pain.     No current facility-administered medications for this visit.     Allergies:   Chlorthalidone; Prednisone; and Bee venom    Social History:  The patient  reports that she has been smoking Cigarettes.  She has a 35.00 pack-year  smoking history. She has never used smokeless tobacco. She reports that she uses drugs, including Hydromorphone. She reports that she does not drink alcohol.   Family History:  The patient's family history includes Cancer in her father and mother; Hypertension in her father.    ROS: All other systems are reviewed and negative. Unless otherwise mentioned in H&P    PHYSICAL EXAM: VS:  There were no vitals taken for this visit. , BMI There is no height or weight on file to calculate BMI. GEN: Well nourished, well developed, in no acute distress HEENT: normal Neck: no JVD, carotid bruits, or masses Cardiac: ***RRR; no murmurs, rubs, or gallops,no edema  Respiratory:  clear to auscultation bilaterally, normal work of breathing GI: soft, nontender, nondistended, + BS MS: no deformity or atrophy Skin: warm and dry, no rash Neuro:  Strength and sensation are intact Psych: euthymic mood, full affect   EKG:  EKG {ACTION; IS/IS [id number]} ordered today. The ekg ordered today demonstrates ***   Recent Labs: No results found for requested labs within last 8760 hours.    Lipid Panel No results found for: CHOL, TRIG, HDL, CHOLHDL, VLDL, LDLCALC, LDLDIRECT    Wt Readings from Last 3 Encounters:  09/29/16 125 lb (56.7 kg)  07/24/13 129 lb 4 oz (58.6 kg)  05/20/13 128 lb 12.8 oz (58.4 kg)      Other studies Reviewed: Additional studies/ records that were reviewed today include: ***. Review of the above records demonstrates: ***   ASSESSMENT AND PLAN:  1.  ***   Current medicines are reviewed at length with the patient today.    Labs/ tests ordered today include: *** No orders of the defined types were placed in this encounter.    Disposition:   FU with *** in {gen number [id number]} {TIME; UNITS DAY/WEEK/MONTH:19136}   Signed, Joni ReiningKathryn Rakeya Glab, NP  11/18/2016 4:33 PM    [facility] [address], [place], [place] [address] Phone: [phone])  [phone]; Fax: [phone]

## 2016-11-19 ENCOUNTER — Encounter: Payer: Self-pay | Admitting: Adult Health

## 2016-11-19 ENCOUNTER — Ambulatory Visit: Payer: 59 | Admitting: Adult Health

## 2017-01-12 ENCOUNTER — Emergency Department (HOSPITAL_COMMUNITY)
Admission: EM | Admit: 2017-01-12 | Discharge: 2017-01-12 | Discharge status: Against Medical Advice | Payer: 59 | Attending: Emergency Medicine | Admitting: Emergency Medicine

## 2017-01-12 ENCOUNTER — Encounter (HOSPITAL_COMMUNITY): Payer: Self-pay | Admitting: *Deleted

## 2017-01-12 DIAGNOSIS — F17210 Nicotine dependence, cigarettes, uncomplicated: Secondary | ICD-10-CM | POA: Diagnosis not present

## 2017-01-12 DIAGNOSIS — I10 Essential (primary) hypertension: Secondary | ICD-10-CM | POA: Diagnosis not present

## 2017-01-12 DIAGNOSIS — M545 Low back pain: Secondary | ICD-10-CM | POA: Diagnosis present

## 2017-01-12 DIAGNOSIS — M5442 Lumbago with sciatica, left side: Secondary | ICD-10-CM | POA: Insufficient documentation

## 2017-01-12 NOTE — ED Provider Notes (Signed)
[facility]-EMERGENCY DEPT Provider Note    By signing my name below, I, Darlene [name], attest that this documentation has been prepared under the direction and in the presence of Darlene Harp, PA-C. Electronically Signed: Earmon [name], ED Scribe. 01/12/17. 10:00 AM.    History   Chief Complaint Chief Complaint  Patient presents with  . Back Pain   The history is provided by the patient and medical records. No language interpreter was used.  Back Pain   Pertinent negatives include no fever, no numbness and no weakness.    Darlene [name] is a 56 y.o. female with PMHx of chronic back pain and HTN who presents to the Emergency Department complaining of shooting low back pain that began progressively worsening 6 days ago. She rates her pain at 8-10/10. She has been taking Vicodin for pain that she gets from her PCP every month. Certain movements increase her pain. She denies alleviating factors. She denies fever, chills, abdominal pain, nausea, vomiting, numbness, tingling or weakness of the lower extremities, visual changes, bowel or bladder incontinence, urinary symtoms. She denies h/o cancer or IV drug use. She denies any trauma, injury or fall. Pt requests an MRI. She states she has an appt with an orthopedist in two days but states she cannot wait.   Past Medical History:  Diagnosis Date  . Essential hypertension, benign   . History of vertigo   . Impaired fasting glucose   . Thyroid nodule     Patient Active Problem List   Diagnosis Date Noted  . Palpitations 07/24/2013  . Precordial pain 07/24/2013  . Essential hypertension, benign 05/20/2013  . Tobacco abuse 05/20/2013    Past Surgical History:  Procedure Laterality Date  . ABDOMINAL HYSTERECTOMY    . CHOLECYSTECTOMY OPEN    . Left hand surgery    . PLACEMENT OF BREAST IMPLANTS      OB History    No data available       Home Medications    Prior to Admission medications   Medication Sig Start Date End Date  Taking? Authorizing Provider  acetaminophen (TYLENOL) 500 MG tablet Take 500 mg by mouth as needed. For pain     Historical Provider, MD  amLODipine (NORVASC) 10 MG tablet Take 5 mg by mouth daily.    Historical Provider, MD  Ascorbic Acid (VITAMIN C) 1000 MG tablet Take 3,000 mg by mouth daily.      Historical Provider, MD  diphenhydrAMINE (BENADRYL) 25 mg capsule Take 25 mg by mouth 2 (two) times daily as needed. For allergies     Historical Provider, MD  HYDROcodone-acetaminophen (NORCO/VICODIN) 5-325 MG tablet Take 5-325 tablets by mouth 3 (three) times daily. 09/25/16   Historical Provider, MD  ibuprofen (ADVIL,MOTRIN) 200 MG tablet Take 200 mg by mouth every 6 (six) hours as needed for pain.    Historical Provider, MD    Family History Family History  Problem Relation Age of Onset  . Hypertension Father   . Cancer Father   . Cancer Mother     Social History Social History  Substance Use Topics  . Smoking status: Current Every Day Smoker    Packs/day: 1.00    Years: 35.00    Types: Cigarettes  . Smokeless tobacco: Never Used  . Alcohol use No     Allergies   Chlorthalidone; Prednisone; and Bee venom   Review of Systems Review of Systems  Constitutional: Negative for chills and fever.  Gastrointestinal: Negative for nausea and vomiting.  No bowel or bladder incontinence  Musculoskeletal: Positive for back pain.  Neurological: Negative for weakness and numbness.     Physical Exam Updated Vital Signs BP 154/98 (BP Location: Right Arm)   Pulse 97   Temp 97.4 F (36.3 C) (Oral)   Resp 16   Ht 5\' 3"  (1.6 m)   Wt 125 lb (56.7 kg)   SpO2 100%   BMI 22.14 kg/m   Physical Exam  Constitutional: She is oriented to person, place, and time. She appears well-developed and well-nourished.  Well appearing  HENT:  Head: Normocephalic and atraumatic.  Nose: Nose normal.  Mouth/Throat: Uvula is midline, oropharynx is clear and moist and mucous membranes are normal.   Eyes: EOM are normal. Pupils are equal, round, and reactive to light.  Neck: Normal range of motion.  Normal ROM, no neck tenderness. No nuchal rigidity  Cardiovascular: Normal rate, normal heart sounds and intact distal pulses.   Pulmonary/Chest: Effort normal and breath sounds normal. No respiratory distress.  Normal work of breathing  Abdominal: Soft. There is no tenderness. There is no rebound and no guarding.  Soft and nontender. No rebound or guarding. No pulsatile mass noted.   Musculoskeletal: Normal range of motion. She exhibits tenderness. She exhibits no deformity.  There is tenderness to lower left lumbar spine. No midline cervical, thoracic tenderness. Mild lumbar tenderness. Good ROM of spine. No deformity. No obvious wound, redness, or swelling noted.   Neurological: She is alert and oriented to person, place, and time. No cranial nerve deficit.  Cranial Nerves:  III,IV, VI: ptosis not present, extra-ocular movements intact bilaterally, direct and consensual pupillary light reflexes intact bilaterally V: facial sensation, jaw opening, and bite strength equal bilaterally VII: eyebrow raise, eyelid close, smile, frown, pucker equal bilaterally VIII: hearing grossly normal bilaterally  IX,X: palate elevation and swallowing intact XI: bilateral shoulder shrug and lateral head rotation equal and strong XII: midline tongue extension  Negative pronator drift, negative Romberg, negative RAM's, negative heel-to-shin, negative finger to nose.    Sensory intact.  Muscle strength 5/5 Patient able to ambulate without difficulty.   SLR Left + SLR right -  Skin: Skin is warm and dry.  Psychiatric: She has a normal mood and affect. Her behavior is normal.  Nursing note and vitals reviewed.    ED Treatments / Results  DIAGNOSTIC STUDIES: Oxygen Saturation is 100% on RA, normal by my interpretation.   COORDINATION OF CARE: 9:26 AM- Explained to pt that an MRI is not indicated at  this time. Explained to pt that the medication that would normally be prescribed is what she already has. Pt angrily walks out of exam room. Pt verbalizes understanding and agrees to plan.  Medications - No data to display  Labs (all labs ordered are listed, but only abnormal results are displayed) Labs Reviewed - No data to display  EKG  EKG Interpretation None       Radiology No results found.  Procedures Procedures (including critical care time)  Medications Ordered in ED Medications - No data to display   Initial Impression / Assessment and Plan / ED Course  I have reviewed the triage vital signs and the nursing notes.  Pertinent labs & imaging results that were available during my care of the patient were reviewed by me and considered in my medical decision making (see chart for details).    Patient walked out after assessment requesting an MRI. She has an appointment with her orthopedist in 2 days  and has pain medications that are given to her by her primary care provider. I told her that chronic back pain with no new neurological findings or any red flags does not warrant an MRI here in the emergency department. She did not take that information well and decided to storm out. I was not able to completely finish my assessment and plan with her.  Patient with back pain.  No neurological deficits and normal neuro exam.  Patient is ambulatory.  No loss of bowel or bladder control.  No concern for cauda equina.  No fever, night sweats, weight loss, h/o cancer, IVDA, no recent procedure to back. No urinary symptoms suggestive of UTI.    I personally performed the services described in this documentation, which was scribed in my presence. The recorded information has been reviewed and is accurate.  Final Clinical Impressions(s) / ED Diagnoses   Final diagnoses:  Acute left-sided low back pain with left-sided sciatica    New Prescriptions Discharge Medication List as of  01/12/2017  9:55 AM       [address] [place], [place] 01/12/17 1000    Arby Barrette, MD 01/21/17 0105

## 2017-01-12 NOTE — ED Triage Notes (Signed)
Pt reports hx of chronic back pain. Increase in lower back pain on Thursday, radiates down left leg. Ambulatory at triage.

## 2017-01-12 NOTE — ED Notes (Signed)
Upon coming in, patient and family member were upset. EDP at bedside. Patient leaving and walking out, refusing to sign for d/c paperwork or have any further assessment.

## 2017-01-12 NOTE — ED Notes (Signed)
Pt walked out of hospital at this time

## 2019-09-13 ENCOUNTER — Observation Stay (HOSPITAL_COMMUNITY)
Admission: EM | Admit: 2019-09-13 | Discharge: 2019-09-14 | Disposition: A | Discharge status: Home / Self Care | Payer: 59 | Attending: Internal Medicine | Admitting: Internal Medicine

## 2019-09-13 ENCOUNTER — Emergency Department (HOSPITAL_COMMUNITY): Payer: 59

## 2019-09-13 ENCOUNTER — Encounter (HOSPITAL_COMMUNITY): Payer: Self-pay

## 2019-09-13 ENCOUNTER — Other Ambulatory Visit: Payer: Self-pay

## 2019-09-13 DIAGNOSIS — I160 Hypertensive urgency: Secondary | ICD-10-CM | POA: Diagnosis not present

## 2019-09-13 DIAGNOSIS — I159 Secondary hypertension, unspecified: Secondary | ICD-10-CM

## 2019-09-13 DIAGNOSIS — F17210 Nicotine dependence, cigarettes, uncomplicated: Secondary | ICD-10-CM | POA: Insufficient documentation

## 2019-09-13 DIAGNOSIS — Z8249 Family history of ischemic heart disease and other diseases of the circulatory system: Secondary | ICD-10-CM | POA: Diagnosis not present

## 2019-09-13 DIAGNOSIS — Z9114 Patient's other noncompliance with medication regimen: Secondary | ICD-10-CM | POA: Diagnosis not present

## 2019-09-13 DIAGNOSIS — E876 Hypokalemia: Secondary | ICD-10-CM | POA: Diagnosis not present

## 2019-09-13 DIAGNOSIS — Z79899 Other long term (current) drug therapy: Secondary | ICD-10-CM | POA: Diagnosis not present

## 2019-09-13 DIAGNOSIS — I671 Cerebral aneurysm, nonruptured: Secondary | ICD-10-CM | POA: Insufficient documentation

## 2019-09-13 DIAGNOSIS — R9431 Abnormal electrocardiogram [ECG] [EKG]: Secondary | ICD-10-CM | POA: Diagnosis not present

## 2019-09-13 DIAGNOSIS — Z20828 Contact with and (suspected) exposure to other viral communicable diseases: Secondary | ICD-10-CM | POA: Insufficient documentation

## 2019-09-13 LAB — BASIC METABOLIC PANEL
Anion gap: 17 — ABNORMAL HIGH (ref 5–15)
BUN: 11 mg/dL (ref 6–20)
CO2: 31 mmol/L (ref 22–32)
Calcium: 9.4 mg/dL (ref 8.9–10.3)
Chloride: 88 mmol/L — ABNORMAL LOW (ref 98–111)
Creatinine, Ser: 0.6 mg/dL (ref 0.44–1.00)
GFR calc Af Amer: >60 mL/min (ref >60)
GFR calc non Af Amer: >60 mL/min (ref >60)
Glucose, Bld: 111 mg/dL — ABNORMAL HIGH (ref 70–99)
Potassium: 2.2 mmol/L — CL (ref 3.5–5.1)
Sodium: 136 mmol/L (ref 135–145)

## 2019-09-13 LAB — CBC
HCT: 37.3 % (ref 36.0–46.0)
Hemoglobin: 12.6 g/dL (ref 12.0–15.0)
MCH: 34 pg (ref 26.0–34.0)
MCHC: 33.8 g/dL (ref 30.0–36.0)
MCV: 100.5 fL — ABNORMAL HIGH (ref 80.0–100.0)
Platelets: 271 10*3/uL (ref 150–400)
RBC: 3.71 MIL/uL — ABNORMAL LOW (ref 3.87–5.11)
RDW: 12.6 % (ref 11.5–15.5)
WBC: 9.9 10*3/uL (ref 4.0–10.5)
nRBC: 0 % (ref 0.0–0.2)

## 2019-09-13 LAB — I-STAT BETA HCG BLOOD, ED (MC, WL, AP ONLY): I-stat hCG, quantitative: <5 m[IU]/mL (ref <5)

## 2019-09-13 LAB — MAGNESIUM: Magnesium: 2.1 mg/dL (ref 1.7–2.4)

## 2019-09-13 LAB — TROPONIN I (HIGH SENSITIVITY): Troponin I (High Sensitivity): 14 ng/L (ref <18)

## 2019-09-13 MED ORDER — HYDRALAZINE HCL 20 MG/ML IJ SOLN: 5.0000 mg | Freq: Once | INTRAMUSCULAR | Status: DC

## 2019-09-13 MED ORDER — HYDRALAZINE HCL 20 MG/ML IJ SOLN
10.0000 mg | Freq: Once | INTRAMUSCULAR | Status: AC
  Administered 2019-09-13: 10 mg via INTRAVENOUS
  Filled 2019-09-13: qty 1

## 2019-09-13 MED ORDER — DIPHENHYDRAMINE HCL 50 MG/ML IJ SOLN
25.0000 mg | Freq: Once | INTRAMUSCULAR | Status: AC
  Administered 2019-09-13: 25 mg via INTRAVENOUS
  Filled 2019-09-13: qty 1

## 2019-09-13 MED ORDER — LORAZEPAM 2 MG/ML IJ SOLN
2.0000 mg | Freq: Once | INTRAMUSCULAR | Status: AC
  Administered 2019-09-13: 2 mg via INTRAVENOUS
  Filled 2019-09-13: qty 1

## 2019-09-13 MED ORDER — POTASSIUM CHLORIDE 10 MEQ/100ML IV SOLN
10.0000 meq | INTRAVENOUS | Status: AC
  Administered 2019-09-13 (×3): 10 meq via INTRAVENOUS
  Filled 2019-09-13 (×2): qty 100

## 2019-09-13 MED ORDER — KETOROLAC TROMETHAMINE 15 MG/ML IJ SOLN
15.0000 mg | Freq: Once | INTRAMUSCULAR | Status: AC
  Administered 2019-09-13: 15 mg via INTRAVENOUS
  Filled 2019-09-13: qty 1

## 2019-09-13 MED ORDER — SODIUM CHLORIDE 0.9% FLUSH
3.0000 mL | Freq: Once | INTRAVENOUS | Status: AC
  Administered 2019-09-13: 3 mL via INTRAVENOUS

## 2019-09-13 MED ORDER — POTASSIUM CHLORIDE 10 MEQ/100ML IV SOLN
10.0000 meq | Freq: Once | INTRAVENOUS | Status: AC
  Administered 2019-09-13: 10 meq via INTRAVENOUS
  Filled 2019-09-13: qty 100

## 2019-09-13 MED ORDER — LACTATED RINGERS IV BOLUS
1000.0000 mL | Freq: Once | INTRAVENOUS | Status: AC
  Administered 2019-09-13: 1000 mL via INTRAVENOUS

## 2019-09-13 MED ORDER — PROCHLORPERAZINE EDISYLATE 10 MG/2ML IJ SOLN
10.0000 mg | Freq: Once | INTRAMUSCULAR | Status: AC
  Administered 2019-09-13: 10 mg via INTRAVENOUS
  Filled 2019-09-13: qty 2

## 2019-09-13 MED ORDER — POTASSIUM CHLORIDE CRYS ER 20 MEQ PO TBCR
40.0000 meq | EXTENDED_RELEASE_TABLET | Freq: Once | ORAL | Status: AC
  Administered 2019-09-13: 40 meq via ORAL
  Filled 2019-09-13: qty 2

## 2019-09-13 NOTE — ED Provider Notes (Signed)
58 year old female received at signout from Dr. Monna Fam, resident, under the supervision of Dr. Madilyn Hook.  Per her HPI:  "The history is provided by the patient.  Hypertension This is a recurrent problem. The current episode started more than 2 days ago. The problem occurs constantly. The problem has been gradually worsening. Associated symptoms include headaches. Pertinent negatives include no chest pain, no abdominal pain and no shortness of breath. Exacerbated by: Looking down. Nothing relieves the symptoms. She has tried rest for the symptoms. The treatment provided no relief.  Headache Pain location:  Occipital Quality:  Sharp Radiates to:  L neck and R neck Timing:  Constant Progression:  Worsening Relieved by:  Resting in a darkened room Worsened by:  Light and neck movement Ineffective treatments:  Resting in a darkened room, NSAIDs and aspirin Associated symptoms: neck pain   Associated symptoms: no abdominal pain, no cough, no fever and no neck stiffness    Patient takes her usual amlodipine, aspirin, chronic back pain medicine.  She has not missed any medications.  She denies any injections, admits to occasional alcohol use, denies any recreational drug use.  Associated symptoms include a headache, dizziness, bilateral neck pain, history of floaters in the left eye 2 days ago (although this is not present currently)."    Physical Exam  BP (!) 211/119   Pulse 82   Temp 99 F (37.2 C) (Oral)   Resp 16   Ht 5\' 3"  (1.6 m)   Wt 52.2 kg   SpO2 96%   BMI 20.37 kg/m   Physical Exam Vitals signs and nursing note reviewed.  Constitutional:      General: She is not in acute distress. HENT:     Head: Normocephalic.  Eyes:     Conjunctiva/sclera: Conjunctivae normal.  Neck:     Musculoskeletal: Neck supple.  Cardiovascular:     Rate and Rhythm: Normal rate and regular rhythm.     Heart sounds: No murmur. No friction rub. No gallop.   Pulmonary:     Effort: Pulmonary effort is  normal. No respiratory distress.  Abdominal:     General: There is no distension.     Palpations: Abdomen is soft.  Skin:    General: Skin is warm.     Findings: No rash.  Neurological:     Mental Status: She is alert.     Comments: Oriented to person, time, and place.  Ambulates with a steady gait.  Psychiatric:        Behavior: Behavior normal.     ED Course/Procedures     Procedures  MDM   58 year old female received at signout from Dr. 59, resident, under the supervision of Dr. Monna Fam.  Please see her note for further work-up and medical decision making.  On my evaluation, the patient is now alert and oriented x3.  Her daughter is at bedside.  She continues to be hypertensive with systolic blood pressure initially in the 190s, but now in the 210s.  We had a lengthy, shared decision-making conversation with her daughter.  The patient is now agreeable to admission for hypertensive urgency.  Hydralazine has been ordered.  Consulted the internal medicine team and Dr. Madilyn Hook with the IM residency team has accepted her for admission. The patient appears reasonably stabilized for admission considering the current resources, flow, and capabilities available in the ED at this time, and I doubt any other [facility] requiring further screening and/or treatment in the ED prior to admission.    Nevaeh Korte,  Iridian Reader A, PA-C 27/06/23 [id number]    Delora Fuel, MD 31/51/76 [phone]

## 2019-09-13 NOTE — ED Provider Notes (Signed)
[facility] EMERGENCY DEPARTMENT Provider Note   CSN: [id number] Arrival date & time: 09/13/19  1722     History   Chief Complaint Chief Complaint  Patient presents with  . Headache  . Dizziness  . Hypertension    HPI Darlene [name] is a 58 y.o. female.     The history is provided by the patient.  Hypertension This is a recurrent problem. The current episode started more than 2 days ago. The problem occurs constantly. The problem has been gradually worsening. Associated symptoms include headaches. Pertinent negatives include no chest pain, no abdominal pain and no shortness of breath. Exacerbated by: Looking down. Nothing relieves the symptoms. She has tried rest for the symptoms. The treatment provided no relief.  Headache Pain location:  Occipital Quality:  Sharp Radiates to:  L neck and R neck Timing:  Constant Progression:  Worsening Relieved by:  Resting in a darkened room Worsened by:  Light and neck movement Ineffective treatments:  Resting in a darkened room, NSAIDs and aspirin Associated symptoms: neck pain   Associated symptoms: no abdominal pain, no cough, no fever and no neck stiffness    Patient takes her usual amlodipine, aspirin, chronic back pain medicine.  She has not missed any medications.  She denies any injections, admits to occasional alcohol use, denies any recreational drug use.  Associated symptoms include a headache, dizziness, bilateral neck pain, history of floaters in the left eye 2 days ago (although this is not present currently).    Past Medical History:  Diagnosis Date  . Essential hypertension, benign   . History of vertigo   . Impaired fasting glucose   . Thyroid nodule     Patient Active Problem List   Diagnosis Date Noted  . Palpitations 07/24/2013  . Precordial pain 07/24/2013  . Essential hypertension, benign 05/20/2013  . Tobacco abuse 05/20/2013    Past Surgical History:  Procedure Laterality Date  .  ABDOMINAL HYSTERECTOMY    . CHOLECYSTECTOMY OPEN    . Left hand surgery    . PLACEMENT OF BREAST IMPLANTS       OB History   No obstetric history on file.      Home Medications    Prior to Admission medications   Medication Sig Start Date End Date Taking? Authorizing Provider  acetaminophen (TYLENOL) 500 MG tablet Take 500 mg by mouth as needed. For pain    Yes [provider]  amLODipine (NORVASC) 2.5 MG tablet Take 2.5 mg by mouth daily. 09/07/19  Yes [provider]  Ascorbic Acid (VITAMIN C) 1000 MG tablet Take 3,000 mg by mouth daily.     Yes [provider]  diphenhydrAMINE (BENADRYL) 25 mg capsule Take 25 mg by mouth 2 (two) times daily as needed. For allergies    Yes [provider]  HYDROcodone-acetaminophen (NORCO) 10-325 MG tablet Take 1 tablet by mouth every 6 (six) hours as needed for pain. 09/07/19  Yes [provider]  ibuprofen (ADVIL,MOTRIN) 200 MG tablet Take 200 mg by mouth every 6 (six) hours as needed for pain.   Yes [provider]    Family History Family History  Problem Relation Age of Onset  . Hypertension Father   . Cancer Father   . Cancer Mother     Social History Social History   Tobacco Use  . Smoking status: Current Every Day Smoker    Packs/day: 1.00    Years: 35.00    Pack years: 35.00  Types: Cigarettes  . Smokeless tobacco: Never Used  Substance Use Topics  . Alcohol use: No  . Drug use: Yes    Types: Hydromorphone     Allergies   Chlorthalidone, Prednisone, and Bee venom   Review of Systems Review of Systems  Constitutional: Negative for fever.  Respiratory: Negative for cough and shortness of breath.   Cardiovascular: Negative for chest pain.  Gastrointestinal: Negative for abdominal pain.  Musculoskeletal: Positive for neck pain. Negative for neck stiffness.  Skin: Negative for rash and wound.  Neurological: Positive for headaches.  Psychiatric/Behavioral: The  patient is nervous/anxious (history of).   All other systems reviewed and are negative.    Physical Exam Updated Vital Signs BP (!) 181/101   Pulse 89   Temp 99 F (37.2 C) (Oral)   Resp 17   Ht 5\' 3"  (1.6 m)   Wt 52.2 kg   SpO2 93%   BMI 20.37 kg/m   Physical Exam Vitals signs and nursing note reviewed.  Constitutional:      General: She is not in acute distress.    Appearance: She is well-developed.  HENT:     Head: Normocephalic and atraumatic.  Eyes:     General: No visual field deficit.    Extraocular Movements: Extraocular movements intact.     Conjunctiva/sclera: Conjunctivae normal.     Pupils: Pupils are equal.  Neck:     Musculoskeletal: Neck supple.     Comments: No midline cervical neck TTP Cardiovascular:     Rate and Rhythm: Normal rate and regular rhythm.     Heart sounds: No murmur.     Comments: Hypertensive, normal heart rate, regular rhythm Pulmonary:     Effort: Pulmonary effort is normal. No respiratory distress.     Breath sounds: Normal breath sounds. No wheezing.  Abdominal:     Palpations: Abdomen is soft.     Tenderness: There is no abdominal tenderness.  Musculoskeletal: Normal range of motion.        General: Swelling present.     Comments: Very mild 1 pitting edema  Skin:    General: Skin is warm and dry.     Capillary Refill: Capillary refill takes less than 2 seconds.  Neurological:     Mental Status: She is alert.     GCS: GCS eye subscore is 4. GCS verbal subscore is 5. GCS motor subscore is 6.     Cranial Nerves: No cranial nerve deficit, dysarthria or facial asymmetry.     Motor: No weakness.     Gait: Gait normal.  Psychiatric:        Mood and Affect: Mood is anxious.        Behavior: Behavior normal.      ED Treatments / Results  Labs (all labs ordered are listed, but only abnormal results are displayed) Labs Reviewed  BASIC METABOLIC PANEL - Abnormal; Notable for the following components:      Result Value    Potassium 2.2 (*)    Chloride 88 (*)    Glucose, Bld 111 (*)    Anion gap 17 (*)    All other components within normal limits  CBC - Abnormal; Notable for the following components:   RBC 3.71 (*)    MCV 100.5 (*)    All other components within normal limits  MAGNESIUM  RAPID URINE DRUG SCREEN, HOSP PERFORMED  I-STAT BETA HCG BLOOD, ED ([facility], [facility], [facility] ONLY)  TROPONIN I (HIGH SENSITIVITY)  TROPONIN I (HIGH  SENSITIVITY)    EKG EKG Interpretation  Date/Time:  Thursday September 13 2019 17:48:28 EDT Ventricular Rate:  92 PR Interval:  128 QRS Duration: 90 QT Interval:  378 QTC Calculation: 467 R Axis:   80 Text Interpretation: Normal sinus rhythm Possible Left atrial enlargement Nonspecific ST abnormality Abnormal ECG no prior available for comparison Confirmed by Tilden Fossa [phone]) on 09/13/2019 5:52:39 PM   Radiology Ct Head Wo Contrast  Result Date: 09/13/2019 CLINICAL DATA:  Headache and dizziness.  History of migraines. EXAM: CT HEAD WITHOUT CONTRAST TECHNIQUE: Contiguous axial images were obtained from the base of the skull through the vertex without intravenous contrast. COMPARISON:  None. FINDINGS: Brain: No evidence of acute infarction, hemorrhage, hydrocephalus, extra-axial collection or mass lesion/mass effect. A few periventricular and subcortical white matter hypodensities are nonspecific, but favored to reflect chronic microvascular ischemic changes. Vascular: No hyperdense vessel or unexpected calcification. Skull: Normal. Negative for fracture or focal lesion. Sinuses/Orbits: No acute finding. Other: None. IMPRESSION: 1. Normal for age noncontrast head CT. Electronically Signed   By: Obie Dredge M.D.   On: 09/13/2019 18:16    Procedures Procedures (including critical care time)  Medications Ordered in ED Medications  potassium chloride 10 mEq in 100 mL IVPB (10 mEq Intravenous New Bag/Given 09/13/19 2029)  sodium chloride flush (NS) 0.9 % injection 3 mL (3 mLs  Intravenous Given 09/13/19 1924)  hydrALAZINE (APRESOLINE) injection 10 mg (10 mg Intravenous Given 09/13/19 1923)  ketorolac (TORADOL) 15 MG/ML injection 15 mg (15 mg Intravenous Given 09/13/19 1923)  lactated ringers bolus 1,000 mL (1,000 mLs Intravenous New Bag/Given 09/13/19 1922)  potassium chloride 10 mEq in 100 mL IVPB (0 mEq Intravenous Stopped 09/13/19 2023)  prochlorperazine (COMPAZINE) injection 10 mg (10 mg Intravenous Given 09/13/19 1923)  diphenhydrAMINE (BENADRYL) injection 25 mg (25 mg Intravenous Given 09/13/19 1924)  potassium chloride SA (KLOR-CON) CR tablet 40 mEq (40 mEq Oral Given 09/13/19 2029)  LORazepam (ATIVAN) injection 2 mg (2 mg Intravenous Given 09/13/19 2029)     Initial Impression / Assessment and Plan / ED Course  I have reviewed the triage vital signs and the nursing notes.  Pertinent labs & imaging results that were available during my care of the patient were reviewed by me and considered in my medical decision making (see chart for details).        Patient presents today for hypertensive urgency versus emergency.  She currently has no symptoms beyond a headache, for which a headache cocktail has been ordered.  She arrives hemodynamically stable despite initial blood pressure greater than systolic 220.  Hydralazine given to bring down blood pressure.  Electrolytes and labs ordered.  EKG showed nonspecific changes, troponins ordered.  Initial troponin negative.  Potassium low, for repletion doses ordered in the emergency department.  Admission recommended, but patient denies admission at this time.  Extensive conversation was had about risks and benefits of staying for treatment for her high blood pressure.  Patient became extremely anxious, and she required 2 mg Ativan to proceed with treatment.  UDS ordered.  After receiving her Ativan, patient became very sleepy.  She continued to receive her potassium repletion that she is willing to accept prior to  Ativan administration.  She would still like to leave AGAINST MEDICAL ADVICE, but she is can stay until completion of her potassium.  She will need to be reassessed to her baseline alert and oriented x3 prior to leaving.  Her daughter will drive her home.  Care of patient  was discussed with the supervising attending. Care handed off to the next provider.  Final Clinical Impressions(s) / ED Diagnoses   Final diagnoses:  Secondary hypertension  Hypertensive urgency  Hypokalemia    ED Discharge Orders    None       Chester HolsteinVaithi, Cheston Coury, MD 09/13/19 2348    Tilden Fossaees, Elizabeth, MD 09/15/19 1251

## 2019-09-13 NOTE — ED Triage Notes (Signed)
Pt reports headache, neck pain, dizziness and hypertension since last week. Pt home Bp readings: 223/123, 214/123, 219/95, 227/139.  Pt taking amlodipine 2.5mg  once daily for htn.   Pt a.o at this time.  BP 232/107 on left arm BP 224/113 on right arm in triage

## 2019-09-14 ENCOUNTER — Observation Stay (HOSPITAL_COMMUNITY): Payer: 59

## 2019-09-14 DIAGNOSIS — I160 Hypertensive urgency: Secondary | ICD-10-CM | POA: Diagnosis not present

## 2019-09-14 DIAGNOSIS — G8929 Other chronic pain: Secondary | ICD-10-CM

## 2019-09-14 DIAGNOSIS — F17210 Nicotine dependence, cigarettes, uncomplicated: Secondary | ICD-10-CM

## 2019-09-14 DIAGNOSIS — I671 Cerebral aneurysm, nonruptured: Secondary | ICD-10-CM | POA: Insufficient documentation

## 2019-09-14 DIAGNOSIS — E876 Hypokalemia: Secondary | ICD-10-CM | POA: Diagnosis not present

## 2019-09-14 DIAGNOSIS — I10 Essential (primary) hypertension: Secondary | ICD-10-CM

## 2019-09-14 DIAGNOSIS — I720 Aneurysm of carotid artery: Secondary | ICD-10-CM | POA: Diagnosis not present

## 2019-09-14 DIAGNOSIS — Z9114 Patient's other noncompliance with medication regimen: Secondary | ICD-10-CM

## 2019-09-14 DIAGNOSIS — Z79899 Other long term (current) drug therapy: Secondary | ICD-10-CM

## 2019-09-14 DIAGNOSIS — Z888 Allergy status to other drugs, medicaments and biological substances status: Secondary | ICD-10-CM

## 2019-09-14 DIAGNOSIS — Z91030 Bee allergy status: Secondary | ICD-10-CM

## 2019-09-14 DIAGNOSIS — M545 Low back pain: Secondary | ICD-10-CM

## 2019-09-14 DIAGNOSIS — Z79891 Long term (current) use of opiate analgesic: Secondary | ICD-10-CM

## 2019-09-14 LAB — LIPID PANEL
Cholesterol: 200 mg/dL (ref 0–200)
HDL: 65 mg/dL (ref >40)
LDL Cholesterol: 116 mg/dL — ABNORMAL HIGH (ref 0–99)
Total CHOL/HDL Ratio: 3.1 RATIO
Triglycerides: 93 mg/dL (ref <150)
VLDL: 19 mg/dL (ref 0–40)

## 2019-09-14 LAB — BASIC METABOLIC PANEL
Anion gap: 15 (ref 5–15)
BUN: 6 mg/dL (ref 6–20)
CO2: 23 mmol/L (ref 22–32)
Calcium: 9.6 mg/dL (ref 8.9–10.3)
Chloride: 100 mmol/L (ref 98–111)
Creatinine, Ser: 0.4 mg/dL — ABNORMAL LOW (ref 0.44–1.00)
GFR calc Af Amer: >60 mL/min (ref >60)
GFR calc non Af Amer: >60 mL/min (ref >60)
Glucose, Bld: 99 mg/dL (ref 70–99)
Potassium: 3.4 mmol/L — ABNORMAL LOW (ref 3.5–5.1)
Sodium: 138 mmol/L (ref 135–145)

## 2019-09-14 LAB — HEMOGLOBIN A1C
Hgb A1c MFr Bld: 5.5 % (ref 4.8–5.6)
Mean Plasma Glucose: 111.15 mg/dL

## 2019-09-14 LAB — SARS CORONAVIRUS 2 (TAT 6-24 HRS): SARS Coronavirus 2: NEGATIVE

## 2019-09-14 LAB — RAPID URINE DRUG SCREEN, HOSP PERFORMED
Amphetamines: NOT DETECTED
Barbiturates: NOT DETECTED
Benzodiazepines: NOT DETECTED
Cocaine: NOT DETECTED
Opiates: POSITIVE — AB
Tetrahydrocannabinol: NOT DETECTED

## 2019-09-14 LAB — TROPONIN I (HIGH SENSITIVITY): Troponin I (High Sensitivity): 13 ng/L (ref <18)

## 2019-09-14 LAB — HIV ANTIBODY (ROUTINE TESTING W REFLEX): HIV Screen 4th Generation wRfx: NONREACTIVE

## 2019-09-14 MED ORDER — AMLODIPINE BESYLATE 5 MG PO TABS: 5.0000 mg | ORAL_TABLET | Freq: Every day | ORAL | Status: DC

## 2019-09-14 MED ORDER — AMLODIPINE BESYLATE 5 MG PO TABS: 5.0000 mg | ORAL_TABLET | Freq: Every day | ORAL | 0 refills | Status: DC

## 2019-09-14 MED ORDER — HYDROCODONE-ACETAMINOPHEN 10-325 MG PO TABS: 1.0000 | ORAL_TABLET | Freq: Four times a day (QID) | ORAL | Status: DC | PRN

## 2019-09-14 MED ORDER — LISINOPRIL 10 MG PO TABS
10.0000 mg | ORAL_TABLET | Freq: Every day | ORAL | Status: DC
  Administered 2019-09-14: 10 mg via ORAL
  Filled 2019-09-14 (×2): qty 1

## 2019-09-14 MED ORDER — SPIRONOLACTONE 25 MG PO TABS: 25.0000 mg | ORAL_TABLET | Freq: Every day | ORAL | 0 refills | Status: DC

## 2019-09-14 MED ORDER — LABETALOL HCL 5 MG/ML IV SOLN
5.0000 mg | Freq: Once | INTRAVENOUS | Status: AC
  Administered 2019-09-14: 5 mg via INTRAVENOUS
  Filled 2019-09-14: qty 4

## 2019-09-14 MED ORDER — SODIUM CHLORIDE 0.9% FLUSH: 3.0000 mL | Freq: Two times a day (BID) | INTRAVENOUS | Status: DC

## 2019-09-14 MED ORDER — ACETAMINOPHEN 650 MG RE SUPP: 650.0000 mg | Freq: Four times a day (QID) | RECTAL | Status: DC | PRN

## 2019-09-14 MED ORDER — LISINOPRIL 10 MG PO TABS: 10.0000 mg | ORAL_TABLET | Freq: Every day | ORAL | 0 refills | Status: DC

## 2019-09-14 MED ORDER — HYDRALAZINE HCL 20 MG/ML IJ SOLN: 10.0000 mg | Freq: Once | INTRAMUSCULAR | Status: DC

## 2019-09-14 MED ORDER — NICOTINE 14 MG/24HR TD PT24
14.0000 mg | MEDICATED_PATCH | Freq: Every day | TRANSDERMAL | Status: DC
  Administered 2019-09-14: 14 mg via TRANSDERMAL
  Filled 2019-09-14: qty 1

## 2019-09-14 MED ORDER — ACETAMINOPHEN 325 MG PO TABS
650.0000 mg | ORAL_TABLET | Freq: Four times a day (QID) | ORAL | Status: DC | PRN
  Administered 2019-09-14: 650 mg via ORAL
  Filled 2019-09-14 (×2): qty 2

## 2019-09-14 MED ORDER — POTASSIUM CHLORIDE 20 MEQ PO PACK
40.0000 meq | PACK | Freq: Once | ORAL | Status: AC
  Administered 2019-09-14: 40 meq via ORAL
  Filled 2019-09-14 (×2): qty 2

## 2019-09-14 MED ORDER — AMLODIPINE BESYLATE 5 MG PO TABS
5.0000 mg | ORAL_TABLET | Freq: Every day | ORAL | Status: DC
  Administered 2019-09-14: 5 mg via ORAL
  Filled 2019-09-14 (×2): qty 1

## 2019-09-14 MED ORDER — ENOXAPARIN SODIUM 40 MG/0.4ML ~~LOC~~ SOLN: 40.0000 mg | SUBCUTANEOUS | Status: DC

## 2019-09-14 NOTE — ED Notes (Signed)
Heart healthy breakfast ordered 

## 2019-09-14 NOTE — ED Notes (Signed)
Paged admitting MD about patient's current vitals. They state they will be by to see patient shortly.

## 2019-09-14 NOTE — ED Notes (Signed)
Pt assited to bathroom by doughter

## 2019-09-14 NOTE — Progress Notes (Signed)
NAME:  Darlene [name], MRN:  [id number], DOB:  [birth year], LOS: 0 ADMISSION DATE:  09/13/2019, Primary: Celene Squibb, MD  CHIEF COMPLAINT: Headache  Medical Service: Internal Medicine Teaching Service         Attending Physician: Dr. Velna Ochs, MD    First Contact: Dr. Darrick Meigs Pager: [phone]  Second Contact: Dr. Sharon Seller Pager: [phone]       After Hours (After 5p/  First Contact Pager: [phone]  weekends / holidays): Second Contact Pager: [phone]    Brief History  This is a 58 year old female with past medical history of uncontrolled hypertension due to medication noncompliance, chronic hypokalemia and tobacco use.  Admitted on 10/29 for headache and hypertensive urgency.  Initial blood pressure was 224/113 which quickly improved with hydralazine and p.o. antihypertensives.  Patient admitted to being noncompliant with antihypertensives.  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  Discussed with neurosurgery who recommended outpatient follow-up for this.  Subjective  Patient notes that she would like to go home.  Discussed aneurysm finding.  Discussed that we will need to refer to neurosurgery outpatient.  Educated on the importance of maintaining good blood pressure control.  Patient also notes that she has a history of hypokalemia as did her father.  Objective   Blood pressure (!) 163/94, pulse 79, temperature 99 F (37.2 C), temperature source Oral, resp. rate 14, height 5\' 3"  (1.6 m), weight 52.2 kg, SpO2 99 %.     Examination: GENERAL: in no acute distress HEENT: head atraumatic. No conjunctival injection. Nares patent.  CARDIAC: heart RRR. No peripheral edema.  PULMONARY: acyanotic.  Mild expiratory wheezes throughout ABDOMEN: soft. Nontender to palpation.   NEURO: CN II-XII grossly intact.  Moving all 4 extremities.  Alert and oriented. SKIN: no rash or lesions on limited exam  PSYCH: A/Ox3. Normal affect  Significant Diagnostic  Tests:  10/29 CT head: No acute findings.  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  Labs    CBC Latest Ref Rng & Units 09/13/2019 05/20/2013 06/13/2007  WBC 4.0 - 10.5 K/uL 9.9 6.6 11.6(H)  Hemoglobin 12.0 - 15.0 g/dL 12.6 11.4(A) 10.6 DELTA CHECK NOTED RESULT REPEATED AND VERIFIED(L)  Hematocrit 36.0 - 46.0 % 37.3 36.8(A) 31.2(L)  Platelets 150 - 400 K/uL 271 - 213 DELTA CHECK NOTED RESULT REPEATED AND VERIFIED   BMP Latest Ref Rng & Units 09/14/2019 09/13/2019 05/20/2013  Glucose 70 - 99 mg/dL 99 111(H) 94  BUN 6 - 20 mg/dL 6 11 10   Creatinine 0.44 - 1.00 mg/dL 0.40(L) 0.60 0.63  Sodium 135 - 145 mmol/L 138 136 130(L)  Potassium 3.5 - 5.1 mmol/L 3.4(L) 2.2(LL) 4.9  Chloride 98 - 111 mmol/L 100 88(L) 99  CO2 22 - 32 mmol/L 23 31 26   Calcium 8.9 - 10.3 mg/dL 9.6 9.4 9.4     Summary  This is a 58 year old female who was admitted to the internal medicine teaching service for headache and hypertensive urgency.  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  Assessment & Plan:  Active Problems:   Hypertensive urgency Hypertensive urgency.  Blood pressure improved this morning.  Headache also improved.  We discussed the importance of better control on blood pressure and medication compliance.  Patient agrees. Plan: Given that  her blood pressure has improved and that she feels strongly about going home, will discharge her home on 10 mg lisinopril and amlodipine.  Instructed her to follow-up with her PCP in 2 to 3 days.  She should check her blood pressure in the meantime and seek sooner treatment if they become elevated again.  Renin aldosterone levels pending.  Given her hypokalemia, cannot rule out hypoaldosteronism.  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  No specific etiology has ever been determined.  Potassium 2.4 on admission.  Replaced and repeat potassium 3.4. Plan: We will give another dose of 40 mEq potassium and have her follow-up with her PCP outpatient.  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  This is a new finding on MRA today.  Patient denies history of being told she had an aneurysm.  Educated on this and discussed the importance of tight blood pressure control.  Discussed the importance of seeking immediate treatment if she develops severe headache or other neurologic changes.  Discussed with neurosurgery, Dr. Yetta Barre, who notes that this can be evaluated further outpatient.  He suggested follow-up in their clinic in 2 weeks. Plan: Appointment scheduled at [facility] neurosurgery with Dr. Conni Elliot.  Patient informed of this.  She implies that she will follow up with them.  Best practice:  CODE STATUS: Full Diet: Regular DVT for prophylaxis: Lovenox Dispo: Given neurosurgery clearance, will discharge later today.  Patient given instructions to follow-up with neurosurgery in 2 weeks.  She will need follow-up with her PCP in 2 to 3 days for further evaluation of her hypertensive urgency.  Elige Radon, MD INTERNAL MEDICINE RESIDENT PGY-1 PAGER #: [phone] 09/14/19 12:48 PM

## 2019-09-14 NOTE — ED Notes (Signed)
Breakfast ordered 

## 2019-09-14 NOTE — H&P (Signed)
Date: 09/14/2019               Patient Name:  Darlene [name] MRN: [id number]  DOB: [birth year] Age / Sex: 58 y.o., female   PCP: Benita Stabile, MD         Medical Service: Internal Medicine Teaching Service         Attending Physician: Dr. Reymundo Poll, MD    First Contact: Ephriam Knuckles, MD, Rylee Pager: [phone])  Second Contact: Cleaster Corin, DO, Jaimie PagerDuane Lope [phone])       After Hours (After 5p/  First Contact Pager: [phone]  weekends / holidays): Second Contact Pager: [phone]   Chief Complaint: headache and neck pain  History of Present Illness: 58 y.o.  female w/ PMHx of hypertension, migraines and chronic back pain presenting with one week history of worsening headache radiating to her neck. History obtained by patient and daughter at bedside. She reports that she has had occipital headaches that are sharp in nature and radiate down bilateral sides of her neck. This is worsened by movement, especially when looking down and walking. She was seen by her chiropractor one week ago for her neck pain and was noted to be hypertensive with SBP>190. She has a history of medication noncompliance and notes that she was prescribed amlodipine five years ago but has not been taking it. She also reports increased stressors in her personal life. Over the past week, patient started taking her amlodipinedaily without any significant improvement in symptoms. She also noted intermittent episodes of lightheadedness throughout the week and also had some floaters in her left visual field yesterday that lasted for a few seconds. She denies any fevers/chills, chest pain, shortness of breath, generalized or focal weakness, numbness/tingling, nausea/vomiting or abdominal pain.   ED course:  Patient presented with headache and bilateral neck pain. Patient noted to have SBP>220 on presentation. EKG wnl and troponins negative. No acute changes on CT Head. Patient given hydralazine and headache  cocktail. She was also noted to be anxious and was given 2mg  Ativan. Patient admitted to internal medicine service for further evaluation and management of her hypertension.    Meds:  Current Meds  Medication Sig  . acetaminophen (TYLENOL) 500 MG tablet Take 500 mg by mouth as needed. For pain   . amLODipine (NORVASC) 2.5 MG tablet Take 2.5 mg by mouth daily.  . Ascorbic Acid (VITAMIN C) 1000 MG tablet Take 3,000 mg by mouth daily.    . diphenhydrAMINE (BENADRYL) 25 mg capsule Take 25 mg by mouth 2 (two) times daily as needed. For allergies   . HYDROcodone-acetaminophen (NORCO) 10-325 MG tablet Take 1 tablet by mouth every 6 (six) hours as needed for pain.  ibuprofen (ADVIL,MOTRIN) 200 MG tablet Take 200 mg by mouth every 6 (six) hours as needed for pain.     Allergies: Allergies as of 09/13/2019 - Review Complete 09/13/2019  Allergen Reaction Noted  . Chlorthalidone  07/24/2013  . Prednisone  07/24/2013  . Bee venom Swelling and Rash 09/06/2011   Past Medical History:  Diagnosis Date  . Essential hypertension, benign   . History of vertigo   . Impaired fasting glucose   . Thyroid nodule     Family History:  Family History  Problem Relation Age of Onset  . Hypertension Father   . Cancer Father   . Cancer Mother      Social History:  Social History   Tobacco Use  . Smoking status: Current  Every Day Smoker    Packs/day: 1.00    Years: 35.00    Pack years: 35.00    Types: Cigarettes  . Smokeless tobacco: Never Used  Substance Use Topics  . Alcohol use: No  . Drug use: Yes    Types: Hydromorphone  Patient lives at home with her husband. She works as a Conservation officer, naturecashier. She reports smoking 1ppd since the age of 58. She denies any significant alcohol use. She does take hydromorphone dialy for her chronic back pain. Otherwise, denies any illicit drug use.   Review of Systems: A complete ROS was negative except as per HPI.   Physical Exam: Blood pressure (!) 197/118, pulse  99, temperature 99 F (37.2 C), temperature source Oral, resp. rate 20, height 5\' 3"  (1.6 m), weight 52.2 kg, SpO2 98 %. Physical Exam Vitals signs and nursing note reviewed.  Constitutional:      General: She is not in acute distress.    Appearance: She is well-developed. She is not ill-appearing, toxic-appearing or diaphoretic.  HENT:     Head: Normocephalic and atraumatic.     Mouth/Throat:     Mouth: Mucous membranes are moist.     Pharynx: Oropharynx is clear.  Eyes:     General: No visual field deficit or scleral icterus.    Extraocular Movements: Extraocular movements intact.     Pupils: Pupils are equal, round, and reactive to light.  Neck:     Musculoskeletal: Normal range of motion and neck supple.  Cardiovascular:     Rate and Rhythm: Regular rhythm. Tachycardia present.     Heart sounds: Normal heart sounds. No murmur. No friction rub. No gallop.   Pulmonary:     Effort: Pulmonary effort is normal. No respiratory distress.     Breath sounds: Normal breath sounds. No wheezing, rhonchi or rales.  Abdominal:     General: Bowel sounds are normal. There is no distension.     Palpations: Abdomen is soft. There is no mass.     Tenderness: There is no abdominal tenderness.  Musculoskeletal: Normal range of motion.        General: No tenderness.  Skin:    General: Skin is warm and dry.     Capillary Refill: Capillary refill takes less than 2 seconds.  Neurological:     Mental Status: She is alert and oriented to person, place, and time.     GCS: GCS eye subscore is 4. GCS verbal subscore is 5. GCS motor subscore is 6.     Cranial Nerves: No cranial nerve deficit, dysarthria or facial asymmetry.     Sensory: No sensory deficit.     Motor: No weakness.     Gait: Gait normal.     Deep Tendon Reflexes: Reflexes normal.     EKG: personally reviewed my interpretation is normal sinus rhythm  CT HEAD WO CONTRAST:  No evidence of acute infarction, hemorrhage, hydrocephalus,  extra-axial collection or mass lesion/mass effect. Chronic microvascular ischemic changes.   Assessment & Plan by Problem:  Patient is a 58yo female with history of hypertension and migraines presenting with one week history of worsening headache radiating to the neck for one week duration and intermittent episodes of lightheadedness and one episode of floaters in left eye day prior to admission. Patient noted to have SBP consistently >200. Concerns for hypertensive urgency.   Hypertensive urgency:  Patient has a history of hypertension, noncompliant with medications and increased stress presenting with one week history of worsening headaches  that radiate to her neck. She was noted to be hypertensive at her chiropractor's office and started taking her amlodipine 2.5mg  without any significant change in her symptoms. She has noted intermittent lightheadedness during the past week, mostly with activity. She also notes one episode of floaters in her left visual field lasting for a few seconds prior to admission. On examination, patient continues to be hypertensive with SBP>200 and tachycardic. She is alert and oriented x4. Reports improvement in headache and neck pain. No obvious focal neurological deficits noted. However, she does note that she had some "hazy" vision prior to assessment. EKG wnl, negative troponins and CT head negative for acute infarction. - MRI brain w/o contrast - Amlodipine 5mg  qd and lisinopril 10mg  qd - Goal: decreasing MAP by 20-25% over first 24 hours  - Cardiac monitoring - HbA1c and lipid panel   Hypokalemia:  K 2.2 on presentation and was given 63mEq of potassium. EKG with normal sinus rhythm.  - f/u BMP - Replete prn  Chronic low back pain:  Patient with history of chronic back pain without any neurologic deficits for which she takes hydrocodone daily. No alarming signs noted on examination. - Acetaminophen 650mg  q6h prn - Continue Norco q6h prn  History of smoking:   Patient with 35 pack year smoking history.  - Nicotine patch 14mg  qd  Diet: Regular  Code: FULL  DVT Prophylaxis: Lovenox   Dispo: Admit patient to Observation with expected length of stay less than 2 midnights.  Signed: Harvie Heck, MD  Internal Medicine, PGY-1 09/14/2019, 4:49 AM  Pager: [phone]

## 2019-09-14 NOTE — Discharge Summary (Signed)
Name: Darlene [name] MRN: [id number] DOB: 1961/09/30 58 y.o. PCP: Benita Stabile, MD  Date of Admission: 09/13/2019  5:38 PM Date of Discharge: 09/14/2019 Attending Physician: No att. providers found  Discharge Diagnosis: 1. Hypertensive urgency.  2. 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 For pain   amLODipine 5 MG tablet Commonly known as: NORVASC Take 1 tablet (5 mg total) by mouth daily. Start taking on: September 15, 2019 What changed:   medication strength  how much to take   diphenhydrAMINE 25 mg capsule Commonly known as: BENADRYL Take 25 mg by mouth 2 (two) times daily as needed. For allergies   HYDROcodone-acetaminophen 10-325 MG tablet Commonly known as: NORCO Take 1 tablet by mouth every 6 (six) hours as needed for pain.   ibuprofen 200 MG tablet Commonly known as: ADVIL Take 200 mg by mouth every 6 (six) hours as needed for pain.   lisinopril 10 MG tablet Commonly known as: ZESTRIL Take 1 tablet (10 mg total) by mouth daily. Start taking on: September 15, 2019   spironolactone 25 MG tablet Commonly known as: Aldactone Take 1 tablet (25 mg total) by mouth daily.   vitamin C 1000 MG tablet Take 3,000 mg by mouth daily.       Disposition and follow-up:   Ms.Jim ANTONINETTE LERNER was discharged from [facility] in Stable condition.  At the hospital follow up visit please address:  1.  Hypertensive urgency. Blood pressure on arrival to ED 224/113. Responded well to IV hydralazine and labetalol. Discharged on lisinopril, spironolactone and amlodipine. Please check BMP at hospital follow up appointment.   2.  8-69mm aneurysm arising from the carotid siphon. Discovered on head imaging obtained in ED. Discussed with neurosurgery who recommends that she follow up outpatient for this. Appointment scheduled for November 12th at 11am.   2.  Labs / imaging needed at time of follow-up: BMP  3.  Pending labs/ test needing follow-up: Renin, aldosterone  Follow-up Appointments: Follow-up Information    Lisbeth Renshaw, MD Follow up in 2 week(s).   Specialty: Neurosurgery Why: Nov. 12 at 11am. Be there 30 min early. Please bring your insurance card with you along with copayment. The clinic will call you in the days preceding to remind you of your appointment. Contact information: [address] [place] [address] [phone]           Hospital Course by problem list: 58 yo female who presented to [facility] ED on 10/29 for evaluation of headache. BP on arrival to ED was found to be 224/113 which improved with labetalol, hydralazine. Upon further questioning, she admits to not being non compliant with her home 2.5mg  amlodipine. SBPs on discharge were 150s-160s which were adequate for slow decrease in blood pressure.   She was also found to be hypokalemic on labs--2.2. Responded well to K supplementation and K was 3.4 on discharge. She notes this is a chronic issue for which she follows with her PCP. Her father also had high blood pressure and hypokalemia. Given this information, we ordered renin  aldosterone levels which will need to be followed up on at follow up appointment which should take place in 2-3d.  Head imaging was also obtained due to headache and hypertension to r/o stroke. No acute findings were noted however an 8-33mm aneurysm arising from the carotid siphon was incidentally discovered. Discussed this with neurosurgery who noted that no inpatient treatment would be required at this time and recommended that she follow up with neurosurgery in 2 weeks. Appointment was  arranged prior to discharge.  Discharge Vitals:   BP (!) 163/94   Pulse 79   Temp 99 F (37.2 C) (Oral)   Resp 14   Ht 5\' 3"  (1.6 m)   Wt 52.2 kg   SpO2 99%   BMI 20.37 kg/m   Pertinent Labs, Studies, and Procedures:  10/29 CT head: No acute findings.  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  BMP Latest Ref Rng & Units 09/14/2019 09/13/2019 05/20/2013  Glucose 70 - 99 mg/dL 99 111(H) 94  BUN 6 - 20 mg/dL 6 11 10   Creatinine 0.44 - 1.00 mg/dL 0.40(L) 0.60 0.63  Sodium 135 - 145 mmol/L 138 136 130(L)  Potassium 3.5 - 5.1 mmol/L 3.4(L) 2.2(LL) 4.9  Chloride 98 - 111 mmol/L 100 88(L) 99  CO2 22 - 32 mmol/L 23 31 26   Calcium 8.9 - 10.3 mg/dL 9.6 9.4 9.4     Discharge Instructions: Discharge Instructions    Diet - low sodium heart healthy   Complete by: As directed    Discharge instructions   Complete by: As directed    Please follow up with [place] [facility] in 2 weeks.   Increase activity slowly   Complete by: As directed       Signed: Mitzi Hansen, MD 09/14/2019, 2:11 PM   Pager: [phone] 09/14/19 2:36 PM

## 2019-09-14 NOTE — ED Notes (Signed)
Lunch Tray Ordered @ 1044.  

## 2019-09-14 NOTE — ED Notes (Signed)
Pt ambulated to bathroom with daughter unassisted.

## 2019-10-02 ENCOUNTER — Other Ambulatory Visit: Payer: Self-pay | Admitting: Neurosurgery

## 2019-10-02 DIAGNOSIS — I671 Cerebral aneurysm, nonruptured: Secondary | ICD-10-CM

## 2019-10-06 ENCOUNTER — Other Ambulatory Visit: Payer: Self-pay | Admitting: Internal Medicine

## 2019-10-09 ENCOUNTER — Other Ambulatory Visit: Payer: Self-pay | Admitting: Internal Medicine

## 2019-10-12 ENCOUNTER — Other Ambulatory Visit (HOSPITAL_COMMUNITY)
Admission: RE | Admit: 2019-10-12 | Discharge: 2019-10-12 | Disposition: A | Discharge status: Home / Self Care | Payer: 59 | Source: Ambulatory Visit | Attending: Neurosurgery | Admitting: Neurosurgery

## 2019-10-12 DIAGNOSIS — Z01812 Encounter for preprocedural laboratory examination: Secondary | ICD-10-CM | POA: Insufficient documentation

## 2019-10-12 DIAGNOSIS — Z20828 Contact with and (suspected) exposure to other viral communicable diseases: Secondary | ICD-10-CM | POA: Diagnosis not present

## 2019-10-12 LAB — SARS CORONAVIRUS 2 (TAT 6-24 HRS): SARS Coronavirus 2: NEGATIVE

## 2019-10-15 ENCOUNTER — Other Ambulatory Visit: Payer: Self-pay | Admitting: Neurosurgery

## 2019-10-15 NOTE — H&P (Signed)
Chief Complaint   Intracranial aneurysm  HPI   HPI: Darlene [name] is a 58 y.o. female  who was incidentally found to have a right carotid aneurysm during workup for uncontrolled high blood pressure. She presents today for diagnostic cerebral angiogram for further characterization.  She is without any concerns.  Of note she has a 40 pack-year history.  No known family history of intracranial aneurysms or hemorrhage.  Patient Active Problem List   Diagnosis Date Noted  . Hypertensive urgency 09/14/2019  . Hypokalemia   . 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 Palpitations 07/24/2013  . Precordial pain 07/24/2013  . Essential hypertension, benign 05/20/2013  . Tobacco abuse 05/20/2013    PMH: Past Medical History:  Diagnosis Date  . Essential hypertension, benign   . History of vertigo   . Impaired fasting glucose   . Thyroid nodule     PSH: Past Surgical History:  Procedure Laterality Date  . ABDOMINAL HYSTERECTOMY    . CHOLECYSTECTOMY OPEN    . Left hand surgery    . PLACEMENT OF BREAST IMPLANTS      (Not in a hospital admission)   SH: Social History   Tobacco Use  . Smoking status: Current Every Day Smoker    Packs/day: 1.00    Years: 35.00    Pack years: 35.00    Types: Cigarettes  . Smokeless tobacco: Never Used  Substance Use Topics  . Alcohol use: No  . Drug use: Yes    Types: Hydromorphone    MEDS: Prior to Admission medications   Medication Sig Start Date End Date Taking? Authorizing Provider  acetaminophen (TYLENOL) 500 MG tablet Take 500 mg by mouth as needed. For pain     [provider]  amLODipine (NORVASC) 5 MG tablet Take 1 tablet (5 mg total) by mouth daily. 09/15/19   Mitzi Hansen, MD  Ascorbic Acid (VITAMIN C) 1000 MG tablet Take 3,000 mg by mouth daily.      [provider]  diphenhydrAMINE (BENADRYL) 25 mg capsule Take 25 mg by mouth 2 (two) times daily as needed. For allergies      [provider]  HYDROcodone-acetaminophen (NORCO) 10-325 MG tablet Take 1 tablet by mouth every 6 (six) hours as needed for pain. 09/07/19   [provider]  ibuprofen (ADVIL,MOTRIN) 200 MG tablet Take 200 mg by mouth every 6 (six) hours as needed for pain.    [provider]  lisinopril (ZESTRIL) 10 MG tablet Take 1 tablet (10 mg total) by mouth daily. 09/15/19   Mitzi Hansen, MD  spironolactone (ALDACTONE) 25 MG tablet Take 1 tablet (25 mg total) by mouth daily. 09/14/19 09/13/20  Mitzi Hansen, MD    ALLERGY: Allergies  Allergen Reactions  . Chlorthalidone     Dizzy spells and electrolyte abnormalities  . Prednisone     Stroke like symptoms   . Bee Venom Swelling and Rash    Social History   Tobacco Use  . Smoking status: Current Every Day Smoker    Packs/day: 1.00    Years: 35.00    Pack years: 35.00    Types: Cigarettes  . Smokeless tobacco: Never Used  Substance Use Topics  . Alcohol use: No     Family History  Problem Relation Age of Onset  . Hypertension Father   . Cancer Father   . Cancer Mother      ROS   ROS  Exam   There  were no vitals filed for this visit. General appearance: WDWN, NAD Eyes: No scleral injection Cardiovascular: Regular rate and rhythm without murmurs, rubs, gallops. No edema or variciosities. Distal pulses normal. Pulmonary: Effort normal, non-labored breathing Musculoskeletal:     Muscle tone upper extremities: Normal    Muscle tone lower extremities: Normal    Motor exam: Upper Extremities Deltoid Bicep Tricep Grip  Right 5/5 5/5 5/5 5/5  Left 5/5 5/5 5/5 5/5   Lower Extremity IP Quad PF DF EHL  Right 5/5 5/5 5/5 5/5 5/5  Left 5/5 5/5 5/5 5/5 5/5   Neurological Mental Status:    - Patient is awake, alert, oriented to person, place, month, year, and situation    - Patient is able to give a clear and coherent history.    - No signs of aphasia or neglect Cranial Nerves    - II: Visual  Fields are full. PERRL    - III/IV/VI: EOMI without ptosis or diploplia.     - V: Facial sensation is grossly normal    - VII: Facial movement is symmetric.     - VIII: hearing is intact to voice    - X: Uvula elevates symmetrically    - XI: Shoulder shrug is symmetric.    - XII: tongue is midline without atrophy or fasciculations.  Sensory: Sensation grossly intact to LT  Results - Imaging/Labs   No results found for this or any previous visit (from the past 48 hour(s)).  No results found.  IMAGING: MRA dated 09/14/2019 was personally reviewed.  This does demonstrate the presence of an approximately 5-6 millimeter likely proximal cavernous right internal carotid artery aneurysm.  Impression/Plan   58 y.o. female   With 5-6 mm right carotid aneurysm incidentally discovered during workup for elevated blood pressure.  We will proceed with diagnostic cerebral angiogram for further characterization.  While in the office risks benefits and alternatives were discussed.  Patient stated understanding and wished to proceed.  Cindra Presume, PA-C [facility]

## 2019-10-16 ENCOUNTER — Other Ambulatory Visit: Payer: Self-pay | Admitting: Neurosurgery

## 2019-10-16 ENCOUNTER — Ambulatory Visit (HOSPITAL_COMMUNITY)
Admission: RE | Admit: 2019-10-16 | Discharge: 2019-10-16 | Disposition: A | Discharge status: Home / Self Care | Payer: 59 | Source: Ambulatory Visit | Attending: Neurosurgery | Admitting: Neurosurgery

## 2019-10-16 ENCOUNTER — Encounter (HOSPITAL_COMMUNITY): Payer: Self-pay | Admitting: Neurosurgery

## 2019-10-16 ENCOUNTER — Other Ambulatory Visit: Payer: Self-pay

## 2019-10-16 DIAGNOSIS — Z888 Allergy status to other drugs, medicaments and biological substances status: Secondary | ICD-10-CM | POA: Insufficient documentation

## 2019-10-16 DIAGNOSIS — I10 Essential (primary) hypertension: Secondary | ICD-10-CM | POA: Diagnosis not present

## 2019-10-16 DIAGNOSIS — F17210 Nicotine dependence, cigarettes, uncomplicated: Secondary | ICD-10-CM | POA: Insufficient documentation

## 2019-10-16 DIAGNOSIS — I671 Cerebral aneurysm, nonruptured: Secondary | ICD-10-CM | POA: Diagnosis present

## 2019-10-16 DIAGNOSIS — Z79899 Other long term (current) drug therapy: Secondary | ICD-10-CM | POA: Insufficient documentation

## 2019-10-16 HISTORY — PX: IR ANGIO VERTEBRAL SEL VERTEBRAL BILAT MOD SED: IMG5369

## 2019-10-16 HISTORY — PX: IR ANGIO INTRA EXTRACRAN SEL INTERNAL CAROTID BILAT MOD SED: IMG5363

## 2019-10-16 LAB — CBC WITH DIFFERENTIAL/PLATELET
Abs Immature Granulocytes: 0.02 10*3/uL (ref 0.00–0.07)
Basophils Absolute: 0.1 10*3/uL (ref 0.0–0.1)
Basophils Relative: 1 %
Eosinophils Absolute: 0.1 10*3/uL (ref 0.0–0.5)
Eosinophils Relative: 2 %
HCT: 39.4 % (ref 36.0–46.0)
Hemoglobin: 12.8 g/dL (ref 12.0–15.0)
Immature Granulocytes: 0 %
Lymphocytes Relative: 40 %
Lymphs Abs: 3 10*3/uL (ref 0.7–4.0)
MCH: 33.8 pg (ref 26.0–34.0)
MCHC: 32.5 g/dL (ref 30.0–36.0)
MCV: 104 fL — ABNORMAL HIGH (ref 80.0–100.0)
Monocytes Absolute: 0.6 10*3/uL (ref 0.1–1.0)
Monocytes Relative: 8 %
Neutro Abs: 3.8 10*3/uL (ref 1.7–7.7)
Neutrophils Relative %: 49 %
Platelets: 297 10*3/uL (ref 150–400)
RBC: 3.79 MIL/uL — ABNORMAL LOW (ref 3.87–5.11)
RDW: 12.9 % (ref 11.5–15.5)
WBC: 7.6 10*3/uL (ref 4.0–10.5)
nRBC: 0 % (ref 0.0–0.2)

## 2019-10-16 LAB — BASIC METABOLIC PANEL
Anion gap: 9 (ref 5–15)
BUN: 7 mg/dL (ref 6–20)
CO2: 30 mmol/L (ref 22–32)
Calcium: 9.3 mg/dL (ref 8.9–10.3)
Chloride: 99 mmol/L (ref 98–111)
Creatinine, Ser: 0.65 mg/dL (ref 0.44–1.00)
GFR calc Af Amer: >60 mL/min (ref >60)
GFR calc non Af Amer: >60 mL/min (ref >60)
Glucose, Bld: 95 mg/dL (ref 70–99)
Potassium: 3.8 mmol/L (ref 3.5–5.1)
Sodium: 138 mmol/L (ref 135–145)

## 2019-10-16 LAB — URINALYSIS, ROUTINE W REFLEX MICROSCOPIC
Bilirubin Urine: NEGATIVE
Glucose, UA: NEGATIVE mg/dL
Hgb urine dipstick: NEGATIVE
Ketones, ur: NEGATIVE mg/dL
Leukocytes,Ua: NEGATIVE
Nitrite: NEGATIVE
Protein, ur: NEGATIVE mg/dL
Specific Gravity, Urine: 1.019 (ref 1.005–1.030)
pH: 8 (ref 5.0–8.0)

## 2019-10-16 LAB — PROTIME-INR
INR: 0.9 (ref 0.8–1.2)
Prothrombin Time: 12.3 seconds (ref 11.4–15.2)

## 2019-10-16 LAB — APTT: aPTT: 31 seconds (ref 24–36)

## 2019-10-16 MED ORDER — CHLORHEXIDINE GLUCONATE CLOTH 2 % EX PADS: 6.0000 | MEDICATED_PAD | Freq: Once | CUTANEOUS | Status: DC

## 2019-10-16 MED ORDER — FENTANYL CITRATE (PF) 100 MCG/2ML IJ SOLN
INTRAMUSCULAR | Status: AC
  Filled 2019-10-16: qty 2

## 2019-10-16 MED ORDER — FENTANYL CITRATE (PF) 100 MCG/2ML IJ SOLN
INTRAMUSCULAR | Status: AC | PRN
  Administered 2019-10-16: 25 ug via INTRAVENOUS

## 2019-10-16 MED ORDER — HEPARIN SODIUM (PORCINE) 1000 UNIT/ML IJ SOLN
INTRAMUSCULAR | Status: AC
  Filled 2019-10-16: qty 1

## 2019-10-16 MED ORDER — IOHEXOL 300 MG/ML  SOLN
150.0000 mL | Freq: Once | INTRAMUSCULAR | Status: AC | PRN
  Administered 2019-10-16: 40 mL via INTRA_ARTERIAL

## 2019-10-16 MED ORDER — CEFAZOLIN SODIUM-DEXTROSE 2-4 GM/100ML-% IV SOLN: 2.0000 g | INTRAVENOUS | Status: DC

## 2019-10-16 MED ORDER — HYDROCODONE-ACETAMINOPHEN 5-325 MG PO TABS: 1.0000 | ORAL_TABLET | ORAL | Status: DC | PRN

## 2019-10-16 MED ORDER — MIDAZOLAM HCL 2 MG/2ML IJ SOLN
INTRAMUSCULAR | Status: AC
  Filled 2019-10-16: qty 2

## 2019-10-16 MED ORDER — LIDOCAINE HCL 1 % IJ SOLN
INTRAMUSCULAR | Status: AC | PRN
  Administered 2019-10-16: 10 mL

## 2019-10-16 MED ORDER — MIDAZOLAM HCL 2 MG/2ML IJ SOLN
INTRAMUSCULAR | Status: AC | PRN
  Administered 2019-10-16: 1 mg via INTRAVENOUS

## 2019-10-16 MED ORDER — SODIUM CHLORIDE 0.9 % IV SOLN: INTRAVENOUS | Status: DC

## 2019-10-16 MED ORDER — HEPARIN SODIUM (PORCINE) 1000 UNIT/ML IJ SOLN
INTRAMUSCULAR | Status: AC | PRN
  Administered 2019-10-16: 2000 [IU] via INTRAVENOUS

## 2019-10-16 MED ORDER — LIDOCAINE HCL 1 % IJ SOLN
INTRAMUSCULAR | Status: AC
  Filled 2019-10-16: qty 20

## 2019-10-16 NOTE — Discharge Instructions (Addendum)
Femoral Site Care You may shower in 24-48 hours and remove bandage. Drink plenty of fluids over the next 24-48 hours.   This sheet gives you information about how to care for yourself after your procedure. Your health care provider may also give you more specific instructions. If you have problems or questions, contact your health care provider. What can I expect after the procedure? After the procedure, it is common to have:  Bruising that usually fades within 1-2 weeks.  Tenderness at the site. Follow these instructions at home: Wound care  Follow instructions from your health care provider about how to take care of your insertion site. Make sure you: ? Wash your hands with soap and water before you change your bandage (dressing). If soap and water are not available, use hand sanitizer. ? Change your dressing as told by your health care provider. ? Leave stitches (sutures), skin glue, or adhesive strips in place. These skin closures may need to stay in place for 2 weeks or longer. If adhesive strip edges start to loosen and curl up, you may trim the loose edges. Do not remove adhesive strips completely unless your health care provider tells you to do that.  Do not take baths, swim, or use a hot tub until your health care provider approves.  You may shower 24-48 hours after the procedure or as told by your health care provider. ? Gently wash the site with plain soap and water. ? Pat the area dry with a clean towel. ? Do not rub the site. This may cause bleeding.  Do not apply powder or lotion to the site. Keep the site clean and dry.  Check your femoral site every day for signs of infection. Check for: ? Redness, swelling, or pain. ? Fluid or blood. ? Warmth. ? Pus or a bad smell. Activity  For the first 2-3 days after your procedure, or as long as directed: ? Avoid climbing stairs as much as possible. ? Do not squat.  Do not lift anything that is heavier than 10 lb (4.5 kg),  or the limit that you are told, until your health care provider says that it is safe.  Rest as directed. ? Avoid sitting for a long time without moving. Get up to take short walks every 1-2 hours.  Do not drive for 24 hours if you were given a medicine to help you relax (sedative). General instructions  Take over-the-counter and prescription medicines only as told by your health care provider.  Keep all follow-up visits as told by your health care provider. This is important. Contact a health care provider if you have:  A fever or chills.  You have redness, swelling, or pain around your insertion site. Get help right away if:  The catheter insertion area swells very fast.  You pass out.  You suddenly start to sweat or your skin gets clammy.  The catheter insertion area is bleeding, and the bleeding does not stop when you hold steady pressure on the area.  The area near or just beyond the catheter insertion site becomes pale, cool, tingly, or numb. These symptoms may represent a serious problem that is an emergency. Do not wait to see if the symptoms will go away. Get medical help right away. Call your local emergency services (911 in the [place] Do not drive yourself to the hospital. Summary  After the procedure, it is common to have bruising that usually fades within 1-2 weeks.  Check your femoral site every  day for signs of infection.  Do not lift anything that is heavier than 10 lb (4.5 kg), or the limit that you are told, until your health care provider says that it is safe. This information is not intended to replace advice given to you by your health care provider. Make sure you discuss any questions you have with your health care provider. Document Released: 07/05/2014 Document Revised: 11/14/2017 Document Reviewed: 11/14/2017 Elsevier Patient Education  2020 [employer].

## 2019-10-16 NOTE — Progress Notes (Signed)
No bleeding or swelling noted after ambulation 

## 2019-10-16 NOTE — Sedation Documentation (Addendum)
Right groin femoral sheath removed, 28fr exoseal closure device used.

## 2019-10-16 NOTE — Brief Op Note (Signed)
  NEUROSURGERY BRIEF OPERATIVE  NOTE   PREOP DX: RICA aneurysm  POSTOP DX: Same  PROCEDURE: Diagnostic cerebral angiogram  SURGEON: Dr. Consuella Lose, MD  ANESTHESIA: IV Sedation with Local  EBL: Minimal  SPECIMENS: None  COMPLICATIONS: None  CONDITION: Stable to recovery  FINDINGS (Full report in [facility]): 1. ~1cm right cavernous ICA aneurysm 2. No other aneurysms/AVMs/fistulas

## 2019-10-23 ENCOUNTER — Other Ambulatory Visit (HOSPITAL_COMMUNITY): Payer: Self-pay | Admitting: Neurosurgery

## 2019-10-23 ENCOUNTER — Other Ambulatory Visit: Payer: Self-pay | Admitting: Neurosurgery

## 2019-10-23 DIAGNOSIS — I671 Cerebral aneurysm, nonruptured: Secondary | ICD-10-CM

## 2019-11-05 ENCOUNTER — Other Ambulatory Visit: Payer: Self-pay | Admitting: Internal Medicine

## 2019-11-10 ENCOUNTER — Other Ambulatory Visit: Payer: Self-pay | Admitting: Internal Medicine

## 2019-11-21 NOTE — Progress Notes (Signed)
[id number] - [place], [place] - [address] AT [facility] [address] [place] [address] Phone: [phone] Fax: [phone]      Your procedure is scheduled on January 12  Report to [facility] Main Entrance "A" at 0945 A.M., and check in at the Admitting office.  Call this number if you have problems the morning of surgery:  [phone]  Call [phone] if you have any questions prior to your surgery date Monday-Friday 8am-4pm    Remember:  Do not eat or drink after midnight the night before your surgery     Take these medicines the morning of surgery with A SIP OF WATER  acetaminophen (TYLENOL)  hydrALAZINE (APRESOLINE)    Follow your surgeon's instructions on when to stop Aspirin/Plavix.  If no instructions were given by your surgeon then you will need to call the office to get those instructions.     7 days prior to surgery STOP taking any Aspirin (unless otherwise instructed by your surgeon), Aleve, Naproxen, Ibuprofen, Motrin, Advil, Goody's, BC's, all herbal medications, fish oil, and all vitamins.    The Morning of Surgery  Do not wear jewelry, make-up or nail polish.  Do not wear lotions, powders, or perfumes/colognes, or deodorant  Do not shave 48 hours prior to surgery.  Men may shave face and neck.  Do not bring valuables to the hospital.  [facility] is not responsible for any belongings or valuables.  If you are a smoker, DO NOT Smoke 24 hours prior to surgery  If you wear a CPAP at night please bring your mask, tubing, and machine the morning of surgery   Remember that you must have someone to transport you home after your surgery, and remain with you for 24 hours if you are discharged the same day.   Please bring cases for contacts, glasses, hearing aids, dentures or bridgework because it cannot be worn into surgery.    Leave your suitcase in the car.  After surgery it may be brought to your room.  For patients admitted to  the hospital, discharge time will be determined by your treatment team.  Patients discharged the day of surgery will not be allowed to drive home.    Special instructions:   [facility]- Preparing For Surgery  Before surgery, you can play an important role. Because skin is not sterile, your skin needs to be as free of germs as possible. You can reduce the number of germs on your skin by washing with CHG (chlorahexidine gluconate) Soap before surgery.  CHG is an antiseptic cleaner which kills germs and bonds with the skin to continue killing germs even after washing.    Oral Hygiene is also important to reduce your risk of infection.  Remember - BRUSH YOUR TEETH THE MORNING OF SURGERY WITH YOUR REGULAR TOOTHPASTE  Please do not use if you have an allergy to CHG or antibacterial soaps. If your skin becomes reddened/irritated stop using the CHG.  Do not shave (including legs and underarms) for at least 48 hours prior to first CHG shower. It is OK to shave your face.  Please follow these instructions carefully.   1. Shower the NIGHT BEFORE SURGERY and the MORNING OF SURGERY with CHG Soap.   2. If you chose to wash your hair, wash your hair first as usual with your normal shampoo.  3. After you shampoo, rinse your hair and body thoroughly to remove the shampoo.  4. Use CHG as you would  any other liquid soap. You can apply CHG directly to the skin and wash gently with a scrungie or a clean washcloth.   5. Apply the CHG Soap to your body ONLY FROM THE NECK DOWN.  Do not use on open wounds or open sores. Avoid contact with your eyes, ears, mouth and genitals (private parts). Wash Face and genitals (private parts)  with your normal soap.   6. Wash thoroughly, paying special attention to the area where your surgery will be performed.  7. Thoroughly rinse your body with warm water from the neck down.  8. DO NOT shower/wash with your normal soap after using and rinsing off the CHG Soap.  9. Pat  yourself dry with a CLEAN TOWEL.  10. Wear CLEAN PAJAMAS to bed the night before surgery, wear comfortable clothes the morning of surgery  11. Place CLEAN SHEETS on your bed the night of your first shower and DO NOT SLEEP WITH PETS.    Day of Surgery:  Please shower the morning of surgery with the CHG soap Do not apply any deodorants/lotions. Please wear clean clothes to the hospital/surgery center.   Remember to brush your teeth WITH YOUR REGULAR TOOTHPASTE.   Please read over the following fact sheets that you were given.

## 2019-11-22 ENCOUNTER — Encounter (HOSPITAL_COMMUNITY)
Admission: RE | Admit: 2019-11-22 | Discharge: 2019-11-22 | Disposition: A | Discharge status: Home / Self Care | Payer: 59 | Source: Ambulatory Visit | Attending: Neurosurgery | Admitting: Neurosurgery

## 2019-11-22 ENCOUNTER — Encounter (HOSPITAL_COMMUNITY): Payer: Self-pay

## 2019-11-22 ENCOUNTER — Other Ambulatory Visit: Payer: Self-pay

## 2019-11-22 DIAGNOSIS — Z01812 Encounter for preprocedural laboratory examination: Secondary | ICD-10-CM | POA: Insufficient documentation

## 2019-11-22 HISTORY — DX: Headache, unspecified: R51.9

## 2019-11-22 HISTORY — DX: Unspecified asthma, uncomplicated: J45.909

## 2019-11-22 HISTORY — DX: Unspecified osteoarthritis, unspecified site: M19.90

## 2019-11-22 HISTORY — DX: Anemia, unspecified: D64.9

## 2019-11-22 HISTORY — DX: Family history of other specified conditions: Z84.89

## 2019-11-22 LAB — CBC WITH DIFFERENTIAL/PLATELET
Abs Immature Granulocytes: 0.02 10*3/uL (ref 0.00–0.07)
Basophils Absolute: 0.1 10*3/uL (ref 0.0–0.1)
Basophils Relative: 1 %
Eosinophils Absolute: 0.2 10*3/uL (ref 0.0–0.5)
Eosinophils Relative: 3 %
HCT: 42.6 % (ref 36.0–46.0)
Hemoglobin: 13.7 g/dL (ref 12.0–15.0)
Immature Granulocytes: 0 %
Lymphocytes Relative: 38 %
Lymphs Abs: 3.1 10*3/uL (ref 0.7–4.0)
MCH: 33.9 pg (ref 26.0–34.0)
MCHC: 32.2 g/dL (ref 30.0–36.0)
MCV: 105.4 fL — ABNORMAL HIGH (ref 80.0–100.0)
Monocytes Absolute: 0.7 10*3/uL (ref 0.1–1.0)
Monocytes Relative: 9 %
Neutro Abs: 4.2 10*3/uL (ref 1.7–7.7)
Neutrophils Relative %: 49 %
Platelets: 349 10*3/uL (ref 150–400)
RBC: 4.04 MIL/uL (ref 3.87–5.11)
RDW: 13.2 % (ref 11.5–15.5)
WBC: 8.3 10*3/uL (ref 4.0–10.5)
nRBC: 0 % (ref 0.0–0.2)

## 2019-11-22 LAB — BASIC METABOLIC PANEL
Anion gap: 13 (ref 5–15)
BUN: 11 mg/dL (ref 6–20)
CO2: 29 mmol/L (ref 22–32)
Calcium: 9.6 mg/dL (ref 8.9–10.3)
Chloride: 97 mmol/L — ABNORMAL LOW (ref 98–111)
Creatinine, Ser: 0.54 mg/dL (ref 0.44–1.00)
GFR calc Af Amer: >60 mL/min (ref >60)
GFR calc non Af Amer: >60 mL/min (ref >60)
Glucose, Bld: 96 mg/dL (ref 70–99)
Potassium: 3.6 mmol/L (ref 3.5–5.1)
Sodium: 139 mmol/L (ref 135–145)

## 2019-11-22 LAB — APTT: aPTT: 31 seconds (ref 24–36)

## 2019-11-22 LAB — URINALYSIS, ROUTINE W REFLEX MICROSCOPIC
Bilirubin Urine: NEGATIVE
Glucose, UA: NEGATIVE mg/dL
Hgb urine dipstick: NEGATIVE
Ketones, ur: NEGATIVE mg/dL
Leukocytes,Ua: NEGATIVE
Nitrite: NEGATIVE
Protein, ur: NEGATIVE mg/dL
Specific Gravity, Urine: 1.01 (ref 1.005–1.030)
pH: 9 — ABNORMAL HIGH (ref 5.0–8.0)

## 2019-11-22 LAB — SURGICAL PCR SCREEN
MRSA, PCR: NEGATIVE
Staphylococcus aureus: NEGATIVE

## 2019-11-22 LAB — PROTIME-INR
INR: 0.9 (ref 0.8–1.2)
Prothrombin Time: 11.7 seconds (ref 11.4–15.2)

## 2019-11-22 NOTE — Progress Notes (Signed)
PCP - Nita Sells Cardiologist - denies  Chest x-ray - n/a EKG - 09-13-19  Blood Thinner Instructions: Follow your surgeon's instructions on when to take / stop Plavix & ASA.  COVID TEST- Friday, January 7th   Anesthesia review: yes, family history of anesthesia complications.  Mother & daughter had difficulty waking up post surgery.  Per patient, mother's temperature dropped during and post surgery.      Patient denies shortness of breath, fever, and chest pain at PAT appointment Per patient, has smoker's cough.   All instructions explained to the patient, with a verbal understanding of the material. Patient agrees to go over the instructions while at home for a better understanding. Patient also instructed to self quarantine after being tested for COVID-19. The opportunity to ask questions was provided.

## 2019-11-23 ENCOUNTER — Other Ambulatory Visit (HOSPITAL_COMMUNITY)
Admission: RE | Admit: 2019-11-23 | Discharge: 2019-11-23 | Disposition: A | Discharge status: Home / Self Care | Payer: 59 | Source: Ambulatory Visit | Attending: Neurosurgery | Admitting: Neurosurgery

## 2019-11-23 ENCOUNTER — Other Ambulatory Visit (HOSPITAL_COMMUNITY): Payer: 59

## 2019-11-23 ENCOUNTER — Other Ambulatory Visit: Payer: Self-pay

## 2019-11-23 DIAGNOSIS — Z20822 Contact with and (suspected) exposure to covid-19: Secondary | ICD-10-CM | POA: Insufficient documentation

## 2019-11-23 DIAGNOSIS — Z01812 Encounter for preprocedural laboratory examination: Secondary | ICD-10-CM | POA: Diagnosis present

## 2019-11-23 LAB — SARS CORONAVIRUS 2 (TAT 6-24 HRS): SARS Coronavirus 2: NEGATIVE

## 2019-11-26 NOTE — H&P (Signed)
Chief Complaint   Aneurysm   HPI   HPI: Darlene [name] is a 59 y.o. female with incidentally discovered right carotid aneurysm after workup for an episode of hypertension.  She underwent diagnostic cerebral angiogram which confirmed large cavernous right carotid aneurysm.  She presents today for surpassed embolization of said aneurysm.  She is without any concerns.  She has been taking aspirin and Plavix for the last 7 days in preparation for the procedure.  Patient Active Problem List   Diagnosis Date Noted  . Hypertensive urgency 09/14/2019  . Hypokalemia   . 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 Palpitations 07/24/2013  . Precordial pain 07/24/2013  . Essential hypertension, benign 05/20/2013  . Tobacco abuse 05/20/2013    PMH: Past Medical History:  Diagnosis Date  . Anemia   . Arthritis   . Asthma    Activity induced  . Essential hypertension, benign   . Family history of adverse reaction to anesthesia    Mom and daughter had difficulty waking up post surgery.  Mother's body temperature dropped during and after surgery.   . Headache   . History of vertigo   . Impaired fasting glucose   . Thyroid nodule     PSH: Past Surgical History:  Procedure Laterality Date  . ABDOMINAL HYSTERECTOMY    . CHOLECYSTECTOMY OPEN    . IR ANGIO INTRA EXTRACRAN SEL INTERNAL CAROTID BILAT MOD SED  10/16/2019  . IR ANGIO VERTEBRAL SEL VERTEBRAL BILAT MOD SED  10/16/2019  . Left hand surgery    . PLACEMENT OF BREAST IMPLANTS      (Not in a hospital admission)   SH: Social History   Tobacco Use  . Smoking status: Current Every Day Smoker    Packs/day: 1.00    Years: 35.00    Pack years: 35.00    Types: Cigarettes  . Smokeless tobacco: Never Used  Substance Use Topics  . Alcohol use: No  . Drug use: Yes    Types: Hydromorphone    MEDS: Prior to Admission medications   Medication Sig Start Date End Date Taking? Authorizing Provider    acetaminophen (TYLENOL) 500 MG tablet Take 500 mg by mouth every 6 (six) hours as needed (pain.). For pain     [provider]  amLODipine (NORVASC) 2.5 MG tablet Take 2.5 mg by mouth every evening.    [provider]  amLODipine (NORVASC) 5 MG tablet Take 1 tablet (5 mg total) by mouth daily. Patient not taking: Reported on 11/19/2019 09/15/19   Elige Radon, MD  Ascorbic Acid (VITAMIN C) 1000 MG tablet Take 5,000 mg by mouth daily.     [provider]  aspirin EC 325 MG tablet Take 325 mg by mouth daily.    [provider]  cholecalciferol (VITAMIN D) 25 MCG (1000 UT) tablet Take 2,000 Units by mouth at bedtime.    [provider]  clopidogrel (PLAVIX) 75 MG tablet Take 75 mg by mouth at bedtime.    [provider]  diphenhydrAMINE (BENADRYL) 25 mg capsule Take 25 mg by mouth 2 (two) times daily as needed. For allergies     [provider]  escitalopram (LEXAPRO) 5 MG tablet Take 5 mg by mouth at bedtime.    [provider]  hydrALAZINE (APRESOLINE) 25 MG tablet Take 25 mg by mouth 2 (two) times daily.    [provider]  HYDROcodone-acetaminophen (NORCO) 10-325 MG tablet Take 1 tablet by  mouth every 6 (six) hours as needed for pain. 09/07/19   [provider]  ibuprofen (ADVIL,MOTRIN) 200 MG tablet Take 400 mg by mouth every 8 (eight) hours as needed.     [provider]  lisinopril (ZESTRIL) 10 MG tablet Take 1 tablet (10 mg total) by mouth daily. 09/15/19   Elige Radon, MD  MAGNESIUM PO Take 200 mg by mouth at bedtime. 1 teaspoon (200 mg)    [provider]  potassium chloride (KLOR-CON) 10 MEQ tablet Take 10 mEq by mouth every evening. 09/17/19   [provider]  spironolactone (ALDACTONE) 25 MG tablet Take 1 tablet (25 mg total) by mouth daily. Patient not taking: Reported on 11/19/2019 09/14/19 09/13/20  Elige Radon, MD    ALLERGY: Allergies  Allergen Reactions   . Chlorthalidone     Dizzy spells and electrolyte abnormalities  . Prednisone     Stroke like symptoms   . Bee Venom Swelling and Rash    Social History   Tobacco Use  . Smoking status: Current Every Day Smoker    Packs/day: 1.00    Years: 35.00    Pack years: 35.00    Types: Cigarettes  . Smokeless tobacco: Never Used  Substance Use Topics  . Alcohol use: No     Family History  Problem Relation Age of Onset  . Hypertension Father   . Cancer Father   . Cancer Mother      ROS   ROS  Exam   There were no vitals filed for this visit. General appearance: WDWN, NAD Eyes: No scleral injection Cardiovascular: Regular rate and rhythm without murmurs, rubs, gallops. No edema or variciosities. Distal pulses normal. Pulmonary: Effort normal, non-labored breathing Musculoskeletal:     Muscle tone upper extremities: Normal    Muscle tone lower extremities: Normal    Motor exam: Upper Extremities Deltoid Bicep Tricep Grip  Right 5/5 5/5 5/5 5/5  Left 5/5 5/5 5/5 5/5   Lower Extremity IP Quad PF DF EHL  Right 5/5 5/5 5/5 5/5 5/5  Left 5/5 5/5 5/5 5/5 5/5   Neurological Mental Status:    - Patient is awake, alert, oriented to person, place, month, year, and situation    - Patient is able to give a clear and coherent history.    - No signs of aphasia or neglect Cranial Nerves    - II: Visual Fields are full. PERRL    - III/IV/VI: EOMI without ptosis or diploplia.     - V: Facial sensation is grossly normal    - VII: Facial movement is symmetric.     - VIII: hearing is intact to voice    - X: Uvula elevates symmetrically    - XI: Shoulder shrug is symmetric.    - XII: tongue is midline without atrophy or fasciculations.  Sensory: Sensation grossly intact to LT  Results - Imaging/Labs   No results found for this or any previous visit (from the past 48 hour(s)).  No results found.  IMAGING: Diagnostic cerebral angiogram was again reviewed demonstrating the  presence of an approximately 11 mm right carotid aneurysm.  Although somewhat unclear, it appears at the neck of the aneurysm is in the cavernous portion of the internal carotid artery.  Regardless, the dome of the aneurysm does appear to be in the region of the supraclinoid ICA.  Impression/Plan   59 y.o. 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  We will proceed with surpass embolization  of the right carotid aneurysm.  She has been taking aspirin Plavix in preparation for the procedure for the past 7 days.  Risks, benefits and alternatives were discussed.  Patient stated understanding and wished to proceed.  Ferne Reus, PA-C [place] [facility] [employer]

## 2019-11-27 ENCOUNTER — Encounter (HOSPITAL_COMMUNITY): Payer: Self-pay | Admitting: Neurosurgery

## 2019-11-27 ENCOUNTER — Other Ambulatory Visit: Payer: Self-pay

## 2019-11-27 ENCOUNTER — Inpatient Hospital Stay (HOSPITAL_COMMUNITY)
Admission: RE | Admit: 2019-11-27 | Discharge: 2019-11-28 | DRG: 027 | Disposition: A | Discharge status: Home / Self Care | Payer: 59 | Attending: Neurosurgery | Admitting: Neurosurgery

## 2019-11-27 ENCOUNTER — Ambulatory Visit (HOSPITAL_COMMUNITY)
Admission: RE | Admit: 2019-11-27 | Discharge: 2019-11-27 | Disposition: A | Discharge status: Home / Self Care | Payer: 59 | Source: Ambulatory Visit | Attending: Neurosurgery | Admitting: Neurosurgery

## 2019-11-27 ENCOUNTER — Inpatient Hospital Stay (HOSPITAL_COMMUNITY): Payer: 59 | Admitting: Physician Assistant

## 2019-11-27 ENCOUNTER — Inpatient Hospital Stay (HOSPITAL_COMMUNITY): Payer: 59 | Admitting: Certified Registered"

## 2019-11-27 ENCOUNTER — Encounter (HOSPITAL_COMMUNITY)
Admission: RE | Disposition: A | Discharge status: Home / Self Care | Payer: Self-pay | Source: Home / Self Care | Attending: Neurosurgery

## 2019-11-27 DIAGNOSIS — I671 Cerebral aneurysm, nonruptured: Secondary | ICD-10-CM

## 2019-11-27 DIAGNOSIS — Z7982 Long term (current) use of aspirin: Secondary | ICD-10-CM | POA: Diagnosis not present

## 2019-11-27 DIAGNOSIS — I10 Essential (primary) hypertension: Secondary | ICD-10-CM | POA: Diagnosis present

## 2019-11-27 DIAGNOSIS — Z8249 Family history of ischemic heart disease and other diseases of the circulatory system: Secondary | ICD-10-CM | POA: Diagnosis not present

## 2019-11-27 DIAGNOSIS — F17210 Nicotine dependence, cigarettes, uncomplicated: Secondary | ICD-10-CM | POA: Diagnosis present

## 2019-11-27 DIAGNOSIS — Z7902 Long term (current) use of antithrombotics/antiplatelets: Secondary | ICD-10-CM

## 2019-11-27 HISTORY — PX: IR TRANSCATH/EMBOLIZ: IMG695

## 2019-11-27 HISTORY — PX: IR ANGIOGRAM FOLLOW UP STUDY: IMG697

## 2019-11-27 HISTORY — PX: IR ANGIO INTRA EXTRACRAN SEL INTERNAL CAROTID UNI R MOD SED: IMG5362

## 2019-11-27 HISTORY — PX: RADIOLOGY WITH ANESTHESIA: SHX6223

## 2019-11-27 LAB — MRSA PCR SCREENING: MRSA by PCR: NEGATIVE

## 2019-11-27 SURGERY — IR WITH ANESTHESIA
Anesthesia: General

## 2019-11-27 MED ORDER — CHLORHEXIDINE GLUCONATE CLOTH 2 % EX PADS: 6.0000 | MEDICATED_PAD | Freq: Every day | CUTANEOUS | Status: DC

## 2019-11-27 MED ORDER — ESCITALOPRAM OXALATE 10 MG PO TABS
5.0000 mg | ORAL_TABLET | Freq: Every day | ORAL | Status: DC
  Administered 2019-11-27: 21:00:00 5 mg via ORAL
  Filled 2019-11-27: qty 1

## 2019-11-27 MED ORDER — CHLORHEXIDINE GLUCONATE CLOTH 2 % EX PADS: 6.0000 | MEDICATED_PAD | Freq: Once | CUTANEOUS | Status: DC

## 2019-11-27 MED ORDER — PROPOFOL 10 MG/ML IV BOLUS
INTRAVENOUS | Status: DC | PRN
  Administered 2019-11-27: 100 mg via INTRAVENOUS
  Administered 2019-11-27: 30 mg via INTRAVENOUS
  Administered 2019-11-27: 100 mg via INTRAVENOUS
  Administered 2019-11-27 (×2): 30 mg via INTRAVENOUS

## 2019-11-27 MED ORDER — SUGAMMADEX SODIUM 200 MG/2ML IV SOLN
INTRAVENOUS | Status: DC | PRN
  Administered 2019-11-27: 200 mg via INTRAVENOUS

## 2019-11-27 MED ORDER — PANTOPRAZOLE SODIUM 40 MG IV SOLR
40.0000 mg | Freq: Every day | INTRAVENOUS | Status: DC
  Filled 2019-11-27: qty 40

## 2019-11-27 MED ORDER — PHENYLEPHRINE HCL-NACL 10-0.9 MG/250ML-% IV SOLN
INTRAVENOUS | Status: DC | PRN
  Administered 2019-11-27: 50 ug/min via INTRAVENOUS

## 2019-11-27 MED ORDER — CLOPIDOGREL BISULFATE 75 MG PO TABS
ORAL_TABLET | ORAL | Status: AC
  Filled 2019-11-27: qty 1

## 2019-11-27 MED ORDER — FENTANYL CITRATE (PF) 100 MCG/2ML IJ SOLN
INTRAMUSCULAR | Status: DC | PRN
  Administered 2019-11-27 (×2): 100 ug via INTRAVENOUS

## 2019-11-27 MED ORDER — FENTANYL CITRATE (PF) 100 MCG/2ML IJ SOLN: 25.0000 ug | INTRAMUSCULAR | Status: DC | PRN

## 2019-11-27 MED ORDER — OXYCODONE HCL 5 MG PO TABS: 5.0000 mg | ORAL_TABLET | Freq: Once | ORAL | Status: DC | PRN

## 2019-11-27 MED ORDER — SENNOSIDES-DOCUSATE SODIUM 8.6-50 MG PO TABS: 1.0000 | ORAL_TABLET | Freq: Every evening | ORAL | Status: DC | PRN

## 2019-11-27 MED ORDER — ACETAMINOPHEN 650 MG RE SUPP: 650.0000 mg | RECTAL | Status: DC | PRN

## 2019-11-27 MED ORDER — PHENYLEPHRINE HCL (PRESSORS) 10 MG/ML IV SOLN
INTRAVENOUS | Status: DC | PRN
  Administered 2019-11-27 (×2): 80 ug via INTRAVENOUS

## 2019-11-27 MED ORDER — ASPIRIN 81 MG PO CHEW: 324.0000 mg | CHEWABLE_TABLET | ORAL | Status: DC

## 2019-11-27 MED ORDER — ONDANSETRON HCL 4 MG/2ML IJ SOLN: 4.0000 mg | INTRAMUSCULAR | Status: DC | PRN

## 2019-11-27 MED ORDER — ASPIRIN 81 MG PO CHEW: 325.0000 mg | CHEWABLE_TABLET | ORAL | Status: DC

## 2019-11-27 MED ORDER — ASPIRIN EC 325 MG PO TBEC
DELAYED_RELEASE_TABLET | ORAL | Status: AC
  Administered 2019-11-27: 11:00:00 325 mg
  Filled 2019-11-27: qty 1

## 2019-11-27 MED ORDER — CLOPIDOGREL BISULFATE 75 MG PO TABS
75.0000 mg | ORAL_TABLET | ORAL | Status: AC
  Administered 2019-11-27: 11:00:00 75 mg via ORAL

## 2019-11-27 MED ORDER — ROCURONIUM BROMIDE 10 MG/ML (PF) SYRINGE
PREFILLED_SYRINGE | INTRAVENOUS | Status: DC | PRN
  Administered 2019-11-27: 30 mg via INTRAVENOUS
  Administered 2019-11-27: 50 mg via INTRAVENOUS
  Administered 2019-11-27: 10 mg via INTRAVENOUS

## 2019-11-27 MED ORDER — ACETAMINOPHEN 325 MG PO TABS: 650.0000 mg | ORAL_TABLET | ORAL | Status: DC | PRN

## 2019-11-27 MED ORDER — MAGNESIUM OXIDE 400 (241.3 MG) MG PO TABS
200.0000 mg | ORAL_TABLET | Freq: Every day | ORAL | Status: DC
  Administered 2019-11-27: 200 mg via ORAL
  Filled 2019-11-27: qty 1

## 2019-11-27 MED ORDER — ONDANSETRON HCL 4 MG/2ML IJ SOLN
INTRAMUSCULAR | Status: DC | PRN
  Administered 2019-11-27: 4 mg via INTRAVENOUS

## 2019-11-27 MED ORDER — POTASSIUM CHLORIDE ER 10 MEQ PO TBCR
10.0000 meq | EXTENDED_RELEASE_TABLET | Freq: Every evening | ORAL | Status: DC
  Administered 2019-11-27: 20:00:00 10 meq via ORAL
  Filled 2019-11-27 (×2): qty 1

## 2019-11-27 MED ORDER — AMLODIPINE BESYLATE 2.5 MG PO TABS: 2.5000 mg | ORAL_TABLET | Freq: Every evening | ORAL | Status: DC

## 2019-11-27 MED ORDER — ONDANSETRON HCL 4 MG PO TABS: 4.0000 mg | ORAL_TABLET | ORAL | Status: DC | PRN

## 2019-11-27 MED ORDER — IOHEXOL 300 MG/ML  SOLN
150.0000 mL | Freq: Once | INTRAMUSCULAR | Status: AC | PRN
  Administered 2019-11-27: 14:00:00 60 mL via INTRA_ARTERIAL

## 2019-11-27 MED ORDER — EPHEDRINE SULFATE 50 MG/ML IJ SOLN
INTRAMUSCULAR | Status: DC | PRN
  Administered 2019-11-27 (×2): 10 mg via INTRAVENOUS

## 2019-11-27 MED ORDER — LISINOPRIL 10 MG PO TABS
10.0000 mg | ORAL_TABLET | Freq: Every day | ORAL | Status: DC
  Administered 2019-11-28: 09:00:00 10 mg via ORAL
  Filled 2019-11-27: qty 1

## 2019-11-27 MED ORDER — HYDRALAZINE HCL 25 MG PO TABS
25.0000 mg | ORAL_TABLET | Freq: Two times a day (BID) | ORAL | Status: DC
  Administered 2019-11-27 – 2019-11-28 (×2): 25 mg via ORAL
  Filled 2019-11-27 (×2): qty 1

## 2019-11-27 MED ORDER — VITAMIN D3 25 MCG (1000 UNIT) PO TABS
2000.0000 [IU] | ORAL_TABLET | Freq: Every day | ORAL | Status: DC
  Administered 2019-11-27: 21:00:00 2000 [IU] via ORAL
  Filled 2019-11-27 (×2): qty 2

## 2019-11-27 MED ORDER — HEPARIN SODIUM (PORCINE) 1000 UNIT/ML IJ SOLN
INTRAMUSCULAR | Status: DC | PRN
  Administered 2019-11-27: 1000 [IU] via INTRAVENOUS
  Administered 2019-11-27: 5000 [IU] via INTRAVENOUS

## 2019-11-27 MED ORDER — LABETALOL HCL 5 MG/ML IV SOLN
INTRAVENOUS | Status: DC | PRN
  Administered 2019-11-27 (×2): 10 mg via INTRAVENOUS

## 2019-11-27 MED ORDER — LABETALOL HCL 5 MG/ML IV SOLN
10.0000 mg | INTRAVENOUS | Status: DC | PRN
  Administered 2019-11-28: 04:00:00 20 mg via INTRAVENOUS
  Filled 2019-11-27: qty 4

## 2019-11-27 MED ORDER — ONDANSETRON HCL 4 MG/2ML IJ SOLN: 4.0000 mg | Freq: Four times a day (QID) | INTRAMUSCULAR | Status: DC | PRN

## 2019-11-27 MED ORDER — LACTATED RINGERS IV SOLN: INTRAVENOUS | Status: DC

## 2019-11-27 MED ORDER — HYDROCODONE-ACETAMINOPHEN 5-325 MG PO TABS
1.0000 | ORAL_TABLET | ORAL | Status: DC | PRN
  Administered 2019-11-27 – 2019-11-28 (×4): 2 via ORAL
  Filled 2019-11-27 (×3): qty 2

## 2019-11-27 MED ORDER — OXYCODONE HCL 5 MG/5ML PO SOLN: 5.0000 mg | Freq: Once | ORAL | Status: DC | PRN

## 2019-11-27 MED ORDER — ASCORBIC ACID 500 MG PO TABS
1000.0000 mg | ORAL_TABLET | Freq: Every day | ORAL | Status: DC
  Administered 2019-11-28: 1000 mg via ORAL
  Filled 2019-11-27: qty 2

## 2019-11-27 MED ORDER — LIDOCAINE 2% (20 MG/ML) 5 ML SYRINGE
INTRAMUSCULAR | Status: DC | PRN
  Administered 2019-11-27: 60 mg via INTRAVENOUS

## 2019-11-27 MED ORDER — DOCUSATE SODIUM 100 MG PO CAPS
100.0000 mg | ORAL_CAPSULE | Freq: Two times a day (BID) | ORAL | Status: DC
  Filled 2019-11-27: qty 1

## 2019-11-27 MED ORDER — HYDROCODONE-ACETAMINOPHEN 5-325 MG PO TABS
ORAL_TABLET | ORAL | Status: AC
  Filled 2019-11-27: qty 2

## 2019-11-27 MED ORDER — CEFAZOLIN SODIUM-DEXTROSE 2-4 GM/100ML-% IV SOLN
2.0000 g | INTRAVENOUS | Status: AC
  Administered 2019-11-27: 12:00:00 2 g via INTRAVENOUS

## 2019-11-27 MED ORDER — CEFAZOLIN SODIUM-DEXTROSE 2-4 GM/100ML-% IV SOLN
INTRAVENOUS | Status: AC
  Filled 2019-11-27: qty 100

## 2019-11-27 MED ORDER — ASPIRIN EC 325 MG PO TBEC
325.0000 mg | DELAYED_RELEASE_TABLET | Freq: Every day | ORAL | Status: DC
  Filled 2019-11-27: qty 1

## 2019-11-27 MED ORDER — CLOPIDOGREL BISULFATE 75 MG PO TABS: 75.0000 mg | ORAL_TABLET | Freq: Every day | ORAL | Status: DC

## 2019-11-27 NOTE — H&P (Signed)
Chief Complaint   Aneurysm   HPI   HPI: Darlene [name] is a 59 y.o. female with incidentally discovered right carotid aneurysm after workup for an episode of hypertension.  She underwent diagnostic cerebral angiogram which confirmed large cavernous right carotid aneurysm.  She presents today for Surpass embolization of said aneurysm.  She is without any concerns.  She has been taking aspirin and Plavix for the last 7 days in preparation for the procedure without any spontaneous bleeding.  Patient Active Problem List   Diagnosis Date Noted  . Hypertensive urgency 09/14/2019  . Hypokalemia   . 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 Palpitations 07/24/2013  . Precordial pain 07/24/2013  . Essential hypertension, benign 05/20/2013  . Tobacco abuse 05/20/2013    PMH: Past Medical History:  Diagnosis Date  . Anemia   . Arthritis   . Asthma    Activity induced  . Essential hypertension, benign   . Family history of adverse reaction to anesthesia    Mom and daughter had difficulty waking up post surgery.  Mother's body temperature dropped during and after surgery.   . Headache   . History of vertigo   . Impaired fasting glucose   . Thyroid nodule     PSH: Past Surgical History:  Procedure Laterality Date  . ABDOMINAL HYSTERECTOMY    . CHOLECYSTECTOMY OPEN    . IR ANGIO INTRA EXTRACRAN SEL INTERNAL CAROTID BILAT MOD SED  10/16/2019  . IR ANGIO VERTEBRAL SEL VERTEBRAL BILAT MOD SED  10/16/2019  . Left hand surgery    . PLACEMENT OF BREAST IMPLANTS      Medications Prior to Admission  Medication Sig Dispense Refill Last Dose  . acetaminophen (TYLENOL) 500 MG tablet Take 500 mg by mouth every 6 (six) hours as needed (pain.). For pain    11/27/2019 at Unknown time  . amLODipine (NORVASC) 2.5 MG tablet Take 2.5 mg by mouth every evening.   11/26/2019 at Unknown time  . Ascorbic Acid (VITAMIN C) 1000 MG tablet Take 5,000 mg by mouth daily.    11/26/2019  at Unknown time  . aspirin EC 325 MG tablet Take 325 mg by mouth daily.   11/26/2019 at Unknown time  . cholecalciferol (VITAMIN D) 25 MCG (1000 UT) tablet Take 2,000 Units by mouth at bedtime.   Past Week at Unknown time  . clopidogrel (PLAVIX) 75 MG tablet Take 75 mg by mouth at bedtime.   11/26/2019 at Unknown time  . diphenhydrAMINE (BENADRYL) 25 mg capsule Take 25 mg by mouth 2 (two) times daily as needed. For allergies    Past Week at Unknown time  . escitalopram (LEXAPRO) 5 MG tablet Take 5 mg by mouth at bedtime.   11/26/2019 at Unknown time  . hydrALAZINE (APRESOLINE) 25 MG tablet Take 25 mg by mouth 2 (two) times daily.   11/27/2019 at Unknown time  . HYDROcodone-acetaminophen (NORCO) 10-325 MG tablet Take 1 tablet by mouth every 6 (six) hours as needed for pain.   11/26/2019 at Unknown time  . ibuprofen (ADVIL,MOTRIN) 200 MG tablet Take 400 mg by mouth every 8 (eight) hours as needed.    Past Week at Unknown time  . lisinopril (ZESTRIL) 10 MG tablet Take 1 tablet (10 mg total) by mouth daily. 30 tablet 0 11/26/2019 at Unknown time  . MAGNESIUM PO Take 200 mg by mouth at bedtime. 1 teaspoon (200 mg)   Past Week at Unknown time  .  potassium chloride (KLOR-CON) 10 MEQ tablet Take 10 mEq by mouth every evening.   11/26/2019 at Unknown time  . amLODipine (NORVASC) 5 MG tablet Take 1 tablet (5 mg total) by mouth daily. (Patient not taking: Reported on 11/19/2019) 30 tablet 0 Not Taking at Unknown time  . spironolactone (ALDACTONE) 25 MG tablet Take 1 tablet (25 mg total) by mouth daily. (Patient not taking: Reported on 11/19/2019) 30 tablet 0 Not Taking at Unknown time    SH: Social History   Tobacco Use  . Smoking status: Current Every Day Smoker    Packs/day: 1.00    Years: 35.00    Pack years: 35.00    Types: Cigarettes  . Smokeless tobacco: Never Used  Substance Use Topics  . Alcohol use: No  . Drug use: Yes    Types: Hydromorphone    MEDS: Prior to Admission medications   Medication  Sig Start Date End Date Taking? Authorizing Provider  acetaminophen (TYLENOL) 500 MG tablet Take 500 mg by mouth every 6 (six) hours as needed (pain.). For pain     [provider]  amLODipine (NORVASC) 2.5 MG tablet Take 2.5 mg by mouth every evening.    [provider]  amLODipine (NORVASC) 5 MG tablet Take 1 tablet (5 mg total) by mouth daily. Patient not taking: Reported on 11/19/2019 09/15/19   Mitzi Hansen, MD  Ascorbic Acid (VITAMIN C) 1000 MG tablet Take 5,000 mg by mouth daily.     [provider]  aspirin EC 325 MG tablet Take 325 mg by mouth daily.    [provider]  cholecalciferol (VITAMIN D) 25 MCG (1000 UT) tablet Take 2,000 Units by mouth at bedtime.    [provider]  clopidogrel (PLAVIX) 75 MG tablet Take 75 mg by mouth at bedtime.    [provider]  diphenhydrAMINE (BENADRYL) 25 mg capsule Take 25 mg by mouth 2 (two) times daily as needed. For allergies     [provider]  escitalopram (LEXAPRO) 5 MG tablet Take 5 mg by mouth at bedtime.    [provider]  hydrALAZINE (APRESOLINE) 25 MG tablet Take 25 mg by mouth 2 (two) times daily.    [provider]  HYDROcodone-acetaminophen (NORCO) 10-325 MG tablet Take 1 tablet by mouth every 6 (six) hours as needed for pain. 09/07/19   [provider]  ibuprofen (ADVIL,MOTRIN) 200 MG tablet Take 400 mg by mouth every 8 (eight) hours as needed.     [provider]  lisinopril (ZESTRIL) 10 MG tablet Take 1 tablet (10 mg total) by mouth daily. 09/15/19   Mitzi Hansen, MD  MAGNESIUM PO Take 200 mg by mouth at bedtime. 1 teaspoon (200 mg)    [provider]  potassium chloride (KLOR-CON) 10 MEQ tablet Take 10 mEq by mouth every evening. 09/17/19   [provider]  spironolactone (ALDACTONE) 25 MG tablet Take 1 tablet (25 mg total) by mouth daily. Patient not taking: Reported on 11/19/2019 09/14/19 09/13/20  Mitzi Hansen, MD    ALLERGY: Allergies  Allergen Reactions  . Chlorthalidone     Dizzy spells and electrolyte abnormalities  . Prednisone     Stroke like symptoms   . Bee Venom Swelling and Rash    Social History   Tobacco Use  . Smoking status: Current Every Day Smoker    Packs/day: 1.00    Years: 35.00    Pack years: 35.00    Types: Cigarettes  . Smokeless tobacco:  Never Used  Substance Use Topics  . Alcohol use: No     Family History  Problem Relation Age of Onset  . Hypertension Father   . Cancer Father   . Cancer Mother      ROS   ROS  Exam   Vitals:   11/27/19 1007  BP: (!) 150/91  Pulse: (!) 103  Resp: 18  Temp: 97.6 F (36.4 C)  SpO2: 100%   General appearance: WDWN, NAD Eyes: No scleral injection Cardiovascular: Regular rate and rhythm without murmurs, rubs, gallops. No edema or variciosities. Distal pulses normal. Pulmonary: Effort normal, non-labored breathing Musculoskeletal:     Muscle tone upper extremities: Normal    Muscle tone lower extremities: Normal    Motor exam: Upper Extremities Deltoid Bicep Tricep Grip  Right 5/5 5/5 5/5 5/5  Left 5/5 5/5 5/5 5/5   Lower Extremity IP Quad PF DF EHL  Right 5/5 5/5 5/5 5/5 5/5  Left 5/5 5/5 5/5 5/5 5/5   Neurological Mental Status:    - Patient is awake, alert, oriented to person, place, month, year, and situation    - Patient is able to give a clear and coherent history.    - No signs of aphasia or neglect Cranial Nerves    - II: Visual Fields are full. PERRL    - III/IV/VI: EOMI without ptosis or diploplia.     - V: Facial sensation is grossly normal    - VII: Facial movement is symmetric.     - VIII: hearing is intact to voice    - X: Uvula elevates symmetrically    - XI: Shoulder shrug is symmetric.    - XII: tongue is midline without atrophy or fasciculations.  Sensory: Sensation grossly intact to LT  Results - Imaging/Labs   IMAGING: Diagnostic cerebral angiogram was again  reviewed demonstrating the presence of an approximately 11 mm right carotid aneurysm.  Although somewhat unclear, it appears at the neck of the aneurysm is in the cavernous portion of the internal carotid artery.  Regardless, the dome of the aneurysm does appear to be in the region of the supraclinoid ICA.  Impression/Plan   59 y.o. 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  We will proceed with Surpass embolization of the right carotid aneurysm.  She has been pretreated with DAPT for the past 7 days.  Risks, benefits, alternative treatment options and expected outcomes were discussed.  Patient stated understanding and wished to proceed. All her questions today were answered and consent was obtained.  Lisbeth Renshaw, MD [facility]

## 2019-11-27 NOTE — Anesthesia Procedure Notes (Signed)
Arterial Line Insertion Start/End1/10/2020 11:00 AM, 11/27/2019 11:35 AM Performed by: Achille Rich, MD, anesthesiologist  Patient location: Pre-op. Preanesthetic checklist: patient identified, IV checked, site marked, risks and benefits discussed, surgical consent, monitors and equipment checked, pre-op evaluation, timeout performed and anesthesia consent Lidocaine 1% used for infiltration Right, radial was placed Catheter size: 20 G Hand hygiene performed  and maximum sterile barriers used   Attempts: 5 or more Procedure performed using ultrasound guided technique. Ultrasound Notes:anatomy identified, needle tip was noted to be adjacent to the nerve/plexus identified and no ultrasound evidence of intravascular and/or intraneural injection Following insertion, dressing applied and Biopatch. Post procedure assessment: normal  Patient tolerated the procedure well with no immediate complications.

## 2019-11-27 NOTE — Anesthesia Preprocedure Evaluation (Signed)
Anesthesia Evaluation  Patient identified by MRN, date of birth, ID band Patient awake    Reviewed: Allergy & Precautions, H&P , NPO status , Patient's Chart, lab work & pertinent test results  Airway Mallampati: II   Neck ROM: full    Dental   Pulmonary asthma , Current Smoker,    breath sounds clear to auscultation       Cardiovascular hypertension,  Rhythm:regular Rate:Normal     Neuro/Psych  Headaches, Right carotid aneurysm    GI/Hepatic   Endo/Other    Renal/GU      Musculoskeletal  (+) Arthritis ,   Abdominal   Peds  Hematology   Anesthesia Other Findings   Reproductive/Obstetrics                             Anesthesia Physical Anesthesia Plan  ASA: II  Anesthesia Plan: General   Post-op Pain Management:    Induction: Intravenous  PONV Risk Score and Plan: 2 and Ondansetron, Dexamethasone, Midazolam and Treatment may vary due to age or medical condition  Airway Management Planned: Oral ETT  Additional Equipment: Arterial line  Intra-op Plan:   Post-operative Plan: Extubation in OR  Informed Consent: I have reviewed the patients History and Physical, chart, labs and discussed the procedure including the risks, benefits and alternatives for the proposed anesthesia with the patient or authorized representative who has indicated his/her understanding and acceptance.       Plan Discussed with: CRNA, Anesthesiologist and Surgeon  Anesthesia Plan Comments:         Anesthesia Quick Evaluation

## 2019-11-27 NOTE — Transfer of Care (Signed)
Immediate Anesthesia Transfer of Care Note  Patient: Darlene [name]  Procedure(s) Performed: Surpass embolization of right carotid aneurysm (N/A )  Patient Location: PACU  Anesthesia Type:General  Level of Consciousness: awake, alert  and oriented  Airway & Oxygen Therapy: Patient Spontanous Breathing and Patient connected to face mask oxygen  Post-op Assessment: Report given to RN and Post -op Vital signs reviewed and stable  Post vital signs: Reviewed and stable  Last Vitals:  Vitals Value Taken Time  BP 106/71 11/27/19 1445  Temp    Pulse 77 11/27/19 1446  Resp 12 11/27/19 1446  SpO2 99 % 11/27/19 1446  Vitals shown include unvalidated device data.  Last Pain:  Vitals:   11/27/19 1034  PainSc: 5       Patients Stated Pain Goal: 1 (11/27/19 1034)  Complications: No apparent anesthesia complications

## 2019-11-27 NOTE — Op Note (Signed)
  NEUROSURGERY BRIEF OPERATIVE  NOTE   PREOP DX: RICA aneurysm  POSTOP DX: Same  PROCEDURE: Surpass Embolization RICA aneurysm  SURGEON: Dr. Lisbeth Renshaw, MD  ANESTHESIA: GETA  EBL: Minimal  SPECIMENS: None  COMPLICATIONS: None  CONDITION: Stable to recovery  FINDINGS (Full report in [facility]): 1. Successful Surpass Evolve 4.5mm x 9mm embolization of RICA aneurysm without immediate thrombotic or embolic complications seen.

## 2019-11-27 NOTE — Anesthesia Procedure Notes (Signed)
Procedure Name: Intubation Date/Time: 11/27/2019 12:15 PM Performed by: Moshe Salisbury, CRNA Pre-anesthesia Checklist: Patient identified, Emergency Drugs available, Suction available and Patient being monitored Patient Re-evaluated:Patient Re-evaluated prior to induction Oxygen Delivery Method: Circle System Utilized Preoxygenation: Pre-oxygenation with 100% oxygen Induction Type: IV induction Ventilation: Mask ventilation without difficulty Laryngoscope Size: Mac and 3 Grade View: Grade I Tube type: Oral Tube size: 7.0 mm Number of attempts: 1 Airway Equipment and Method: Stylet Placement Confirmation: ETT inserted through vocal cords under direct vision,  positive ETCO2 and breath sounds checked- equal and bilateral Secured at: 21 cm Tube secured with: Tape Dental Injury: Teeth and Oropharynx as per pre-operative assessment  Comments: Darlene [name] intubated under CRNA superivision

## 2019-11-28 ENCOUNTER — Encounter: Payer: Self-pay | Admitting: *Deleted

## 2019-11-28 MED ORDER — NICOTINE 21 MG/24HR TD PT24
21.0000 mg | MEDICATED_PATCH | Freq: Every day | TRANSDERMAL | Status: DC
  Administered 2019-11-28: 21 mg via TRANSDERMAL
  Filled 2019-11-28: qty 1

## 2019-11-28 NOTE — Progress Notes (Signed)
Contacted Neurosurgery MD [place]. Patient is a daily smoker. Patient is having nicotine cravings and beginning to be slightly restless. Patient has inquired about getting a nicotine patch. Dr. Maurice Small gave verbal orders for a daily 21 mg nicotine patch. Will continue to monitor.

## 2019-11-28 NOTE — Progress Notes (Signed)
  NEUROSURGERY PROGRESS NOTE   No issues overnight.  No concerns this am No HA, dizziness, N/T/W  EXAM:  BP (!) 142/77   Pulse 63   Temp 98.5 F (36.9 C) (Oral)   Resp 14   Ht 5\' 3"  (1.6 m)   Wt 50.8 kg   SpO2 96%   BMI 19.84 kg/m   Awake, alert, oriented  Speech fluent, appropriate  CN grossly intact  5/5 BUE/BLE  Access site c/d/i, no significant hematoma  IMPRESSION/PLAN 59 y.o. female pod #1 RICA aneurysm surpass. Doing well - d/c home

## 2019-11-28 NOTE — Discharge Summary (Signed)
Physician Discharge Summary  Patient ID: Darlene [name] MRN: [id number] DOB/AGE: 1961/09/22 59 y.o.  Admit date: 11/27/2019 Discharge date: 11/28/2019  Admission Diagnoses:  Cerebral aneurysm, unruptured  Discharge Diagnoses:  Same Active Problems:   Cerebral aneurysm, nonruptured  Discharged Condition: Stable  Hospital Course:  Darlene [name] is a 59 y.o. female who was admitted for the below procedure. There were no post operative complications. At time of discharge, pain was well controlled, ambulating with Pt/OT, tolerating po, voiding normal. Ready for discharge.  Treatments: Surgery Successful Surpass Evolve 4.46mm x 74mm embolization of RICA aneurysm without immediate thrombotic or embolic complications seen.  Discharge Exam: Blood pressure (!) 142/77, pulse 63, temperature 98.5 F (36.9 C), temperature source Oral, resp. rate 14, height 5\' 3"  (1.6 m), weight 50.8 kg, SpO2 96 %. Awake, alert, oriented Speech fluent, appropriate CN grossly intact 5/5 BUE/BLE Wound c/d/i  Disposition: Discharge disposition: 01-Home or Self Care       Discharge Instructions    Call MD for:  difficulty breathing, headache or visual disturbances   Complete by: As directed    Call MD for:  persistant dizziness or light-headedness   Complete by: As directed    Call MD for:  redness, tenderness, or signs of infection (pain, swelling, redness, odor or green/yellow discharge around incision site)   Complete by: As directed    Call MD for:  severe uncontrolled pain   Complete by: As directed    Call MD for:  temperature >100.4   Complete by: As directed    Diet general   Complete by: As directed    Increase activity slowly   Complete by: As directed    Remove dressing in 24 hours   Complete by: As directed      Allergies as of 11/28/2019      Reactions   Chlorthalidone    Dizzy spells and electrolyte abnormalities   Prednisone    Stroke like symptoms   Bee Venom Swelling, Rash       Medication List    STOP taking these medications   spironolactone 25 MG tablet Commonly known as: Aldactone     TAKE these medications   acetaminophen 500 MG tablet Commonly known as: TYLENOL Take 500 mg by mouth every 6 (six) hours as needed (pain.). For pain   amLODipine 2.5 MG tablet Commonly known as: NORVASC Take 2.5 mg by mouth every evening. What changed: Another medication with the same name was removed. Continue taking this medication, and follow the directions you see here.   aspirin EC 325 MG tablet Take 325 mg by mouth daily.   cholecalciferol 25 MCG (1000 UNIT) tablet Commonly known as: VITAMIN D Take 2,000 Units by mouth at bedtime.   clopidogrel 75 MG tablet Commonly known as: PLAVIX Take 75 mg by mouth at bedtime.   diphenhydrAMINE 25 mg capsule Commonly known as: BENADRYL Take 25 mg by mouth 2 (two) times daily as needed. For allergies   escitalopram 5 MG tablet Commonly known as: LEXAPRO Take 5 mg by mouth at bedtime.   hydrALAZINE 25 MG tablet Commonly known as: APRESOLINE Take 25 mg by mouth 2 (two) times daily.   HYDROcodone-acetaminophen 10-325 MG tablet Commonly known as: NORCO Take 1 tablet by mouth every 6 (six) hours as needed for pain.   ibuprofen 200 MG tablet Commonly known as: ADVIL Take 400 mg by mouth every 8 (eight) hours as needed.   lisinopril 10 MG tablet Commonly known as: ZESTRIL Take  1 tablet (10 mg total) by mouth daily.   MAGNESIUM PO Take 200 mg by mouth at bedtime. 1 teaspoon (200 mg)   potassium chloride 10 MEQ tablet Commonly known as: KLOR-CON Take 10 mEq by mouth every evening.   vitamin C 1000 MG tablet Take 5,000 mg by mouth daily.      Follow-up Information    Darlene Lose, MD Follow up.   Specialty: Neurosurgery Contact information: [address] [place] [address] [place] [address] [phone]           Signed: Traci Sermon 11/28/2019, 8:45 AM

## 2019-11-30 NOTE — Anesthesia Postprocedure Evaluation (Signed)
Anesthesia Post Note  Patient: Darlene [name]  Procedure(s) Performed: Surpass embolization of right carotid aneurysm (N/A )     Patient location during evaluation: PACU Anesthesia Type: General Level of consciousness: awake and alert Pain management: pain level controlled Vital Signs Assessment: post-procedure vital signs reviewed and stable Respiratory status: spontaneous breathing, nonlabored ventilation, respiratory function stable and patient connected to nasal cannula oxygen Cardiovascular status: blood pressure returned to baseline and stable Postop Assessment: no apparent nausea or vomiting Anesthetic complications: no    Last Vitals:  Vitals:   11/28/19 0700 11/28/19 0800  BP: (!) 142/77 139/82  Pulse: 63 68  Resp: 14 16  Temp:    SpO2: 96% 97%    Last Pain:  Vitals:   11/28/19 0400  TempSrc: Oral  PainSc: 3                  Fe Okubo S

## 2020-02-19 LAB — ALDOSTERONE + RENIN ACTIVITY W/ RATIO
ALDO / PRA Ratio: >7.8 (ref 0.0–30.0)
Aldosterone: 1.3 ng/dL (ref 0.0–30.0)
PRA LC/MS/MS: <0.167 ng/mL/hr — ABNORMAL LOW (ref 0.167–5.380)

## 2020-02-20 IMAGING — US IR ANGIO VETEBRAL SEL VERTEBRAL BILAT MOD SED
1 series · 1 of 1 positions shown · non-contrast
Comparison: none

PROCEDURE:
DIAGNOSTIC CEREBRAL ANGIOGRAM
HISTORY: The patient is a 58-year-old woman with incidentally discovered
right internal carotid artery aneurysm and workup of the aneurysm
with diagnostic cerebral angiogram. 58-year-old woman with
incidentally discovered right internal carotid artery aneurysm and a
history of poorly controlled hypertension. A history of poorly
controlled hypertension. She therefore presents today for further
She therefore presents today for further work-up of the aneurysm
with diagnostic cerebral angiogram.
TECHNIQUE: CATHETERS AND WIRES
5-French JB-1 catheter

[Series 1: ir cnsa ang vrtbl sel vrtbl bi · 1 of 1 slices shown]
[im 1/1]
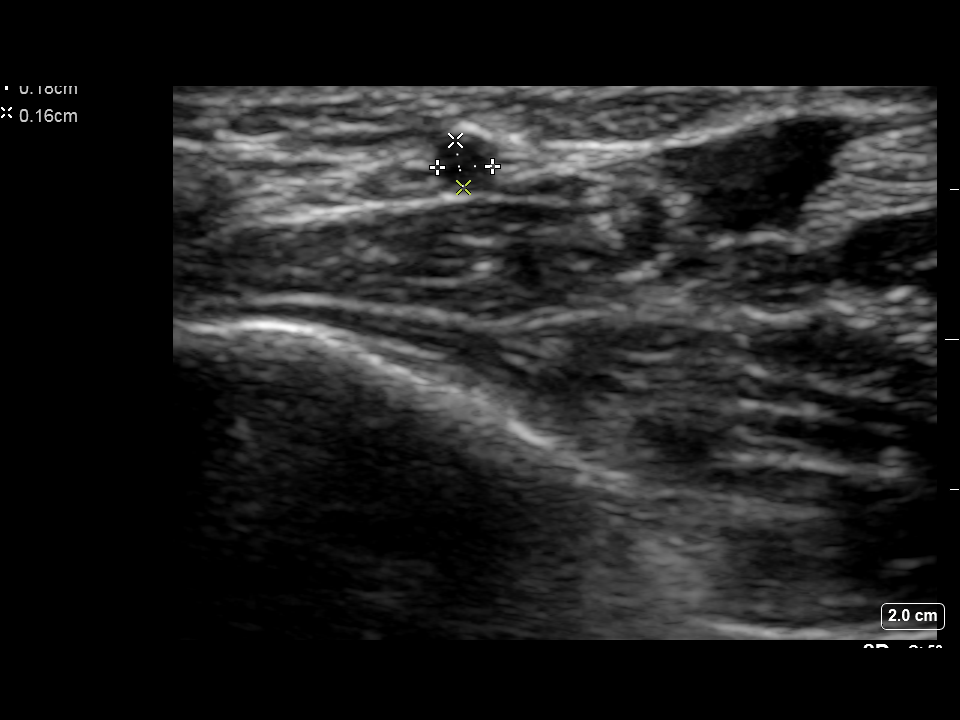

[1 of 1 positions shown; findings below may reference images not displayed]

ACCESS:
The technical aspects of the procedure as well as its potential
risks and benefits were reviewed with the patient. These risks
included but were not limited bleeding, infection, allergic
reaction, damage to organs or vital structures, stroke,
non-diagnostic procedure, and the catastrophic outcomes of heart
attack, coma, and death. With an understanding of these risks,
informed consent was obtained and witnessed. The patient was placed
in the supine position on the angiography table and the skin of
right groin prepped in the usual sterile fashion.

The procedure was performed under local anesthesia (1%-solution of
bicarbonate-buffered Lidocaine) and conscious sedation with 1mg
versed and 70micrograms fentanyl monitored by myself and the
in-suite nurse using continuous pulse-oximetry, heart rate, and
non-invasive blood-pressure.

A 5- French sheath was introduced in the right common femoral artery
using Seldinger technique. A fluoro-phase sequence was used to
document the sheath position.

MEDICATIONS:
HEPARIN: 6333 Units total.

CONTRAST:  40mL OMNIPAQUE IOHEXOL 300 MG/ML  SOLNcc, Omnipaque 300

FLUOROSCOPY TIME:  FLUOROSCOPY TIME: See IR records
0.035" glidewire

VESSELS CATHETERIZED
Right internal carotid

Left internal carotid

Left vertebral

Right vertebral

Right common femoral

VESSELS STUDIED
Right internal carotid, head

Left internal carotid, head

Left vertebral

Right vertebral

Right femoral

PROCEDURAL NARRATIVE
A 5-Fr JB-1 catheter was advanced over a 0.035 glidewire into the
aortic arch. The above vessels were then sequentially catheterized
and cervical / cerebral angiograms taken. After review of images,
the catheter was removed without incident.
FINDINGS: Right internal carotid, head:

Injection reveals the presence of a widely patent ICA, M1, and A1
segments and their branches. There is an irregular, apparent bilobed
aneurysm of the right internal carotid artery. Although somewhat
difficult to determine exactly where the neck of the aneurysm is, it
appears to be in the cavernous segment of the internal carotid
artery rather than the supraclinoid portion. Aneurysm measures
approximately 10.6 x 8.9 by 7.8 mm. Aneurysm projects superiorly and
laterally. The parenchymal and venous phases are normal. The venous
sinuses are widely patent.

Left internal carotid, head:

Injection reveals the presence of a widely patent ICA, A1, and M1
segments and their branches. No aneurysms, AVMs, or high-flow
fistulas are seen. The parenchymal and venous phases are normal. The
venous sinuses are widely patent.

Left vertebral:

Injection reveals the presence of a widely patent vertebral artery.
This leads to a widely patent basilar artery that terminates in
bilateral P1. The basilar apex is normal. No aneurysms, AVMs, or
high-flow fistulas are seen. The parenchymal and venous phases are
normal. The venous sinuses are widely patent.

Right vertebral:

The vertebral artery is widely patent. No PICA aneurysm is seen. See
basilar description above.

Right femoral:

Normal vessel. No significant atherosclerotic disease. Arterial
sheath in adequate position.

DISPOSITION:
Upon completion of the study, the femoral sheath was removed and
hemostasis obtained using a 5-Fr ExoSeal closure device. Good
proximal and distal lower extremity pulses were documented upon
achievement of hemostasis. The procedure was well tolerated and no
early complications were observed. The patient was transferred to
the holding area to lay flat for 2 hours.
IMPRESSION: 1. Approximately 10.6 mm right cavernous internal carotid artery
aneurysm as described above.

The preliminary results of this procedure were shared with the
patient and the patient's family.

## 2020-05-02 ENCOUNTER — Other Ambulatory Visit: Payer: Self-pay | Admitting: Neurosurgery

## 2020-05-02 DIAGNOSIS — I671 Cerebral aneurysm, nonruptured: Secondary | ICD-10-CM

## 2020-05-06 ENCOUNTER — Other Ambulatory Visit: Payer: Self-pay | Admitting: Neurosurgery

## 2020-05-20 ENCOUNTER — Other Ambulatory Visit (HOSPITAL_COMMUNITY)
Admission: RE | Admit: 2020-05-20 | Discharge: 2020-05-20 | Disposition: A | Discharge status: Home / Self Care | Payer: 59 | Source: Ambulatory Visit | Attending: Neurosurgery | Admitting: Neurosurgery

## 2020-05-20 DIAGNOSIS — Z20822 Contact with and (suspected) exposure to covid-19: Secondary | ICD-10-CM | POA: Insufficient documentation

## 2020-05-20 DIAGNOSIS — Z01812 Encounter for preprocedural laboratory examination: Secondary | ICD-10-CM | POA: Diagnosis not present

## 2020-05-20 LAB — SARS CORONAVIRUS 2 (TAT 6-24 HRS): SARS Coronavirus 2: NEGATIVE

## 2020-05-21 ENCOUNTER — Other Ambulatory Visit: Payer: Self-pay | Admitting: Student

## 2020-05-21 NOTE — H&P (Signed)
Chief Complaint   Aneurysm   HPI   HPI: Darlene [name] is a 59 y.o. female approximately six months status post surpass embolization of a right carotid aneurysm.   She has been taking aspirin and Plavix on a daily basis.  She comes in today for routine follow-up diagnostic cerebral angiogram for continued surveillance.  She is without any concerns.  She has not noted any abnormal bleeding.  Patient Active Problem List   Diagnosis Date Noted  . Cerebral aneurysm, nonruptured 11/27/2019  . Hypertensive urgency 09/14/2019  . Hypokalemia   . 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 Palpitations 07/24/2013  . Precordial pain 07/24/2013  . Essential hypertension, benign 05/20/2013  . Tobacco abuse 05/20/2013    PMH: Past Medical History:  Diagnosis Date  . Anemia   . Arthritis   . Asthma    Activity induced  . Essential hypertension, benign   . Family history of adverse reaction to anesthesia    Mom and daughter had difficulty waking up post surgery.  Mother's body temperature dropped during and after surgery.   . Headache   . History of vertigo   . Impaired fasting glucose   . Thyroid nodule     PSH: Past Surgical History:  Procedure Laterality Date  . ABDOMINAL HYSTERECTOMY    . CHOLECYSTECTOMY OPEN    . IR ANGIO INTRA EXTRACRAN SEL INTERNAL CAROTID BILAT MOD SED  10/16/2019  . IR ANGIO INTRA EXTRACRAN SEL INTERNAL CAROTID UNI R MOD SED  11/27/2019  . IR ANGIO VERTEBRAL SEL VERTEBRAL BILAT MOD SED  10/16/2019  . IR ANGIOGRAM FOLLOW UP STUDY  11/27/2019  . IR TRANSCATH/EMBOLIZ  11/27/2019  . Left hand surgery    . PLACEMENT OF BREAST IMPLANTS    . RADIOLOGY WITH ANESTHESIA N/A 11/27/2019   Procedure: Surpass embolization of right carotid aneurysm;  Surgeon: Lisbeth Renshaw, MD;  Location: [facility] OR;  Service: Radiology;  Laterality: N/A;    (Not in a hospital admission)   SH: Social History   Tobacco Use  . Smoking status: Current Every Day  Smoker    Packs/day: 1.00    Years: 35.00    Pack years: 35.00    Types: Cigarettes  . Smokeless tobacco: Never Used  Vaping Use  . Vaping Use: Never used  Substance Use Topics  . Alcohol use: No  . Drug use: Yes    Types: Hydromorphone    MEDS: Prior to Admission medications   Medication Sig Start Date End Date Taking? Authorizing Provider  acetaminophen (TYLENOL) 500 MG tablet Take 500 mg by mouth every 6 (six) hours as needed (pain.). For pain     [provider]  amLODipine (NORVASC) 2.5 MG tablet Take 2.5 mg by mouth every evening.    [provider]  Ascorbic Acid (VITAMIN C) 1000 MG tablet Take 5,000 mg by mouth daily.     [provider]  aspirin EC 325 MG tablet Take 325 mg by mouth daily.    [provider]  cholecalciferol (VITAMIN D) 25 MCG (1000 UT) tablet Take 2,000 Units by mouth at bedtime.    [provider]  clopidogrel (PLAVIX) 75 MG tablet Take 75 mg by mouth at bedtime.    [provider]  diphenhydrAMINE (BENADRYL) 25 mg capsule Take 25 mg by mouth 2 (two) times daily as needed. For allergies     [provider]  escitalopram (LEXAPRO) 5 MG tablet Take 5 mg  by mouth at bedtime.    [provider]  hydrALAZINE (APRESOLINE) 25 MG tablet Take 25 mg by mouth 2 (two) times daily.    [provider]  HYDROcodone-acetaminophen (NORCO) 10-325 MG tablet Take 1 tablet by mouth every 6 (six) hours as needed for pain. 09/07/19   [provider]  ibuprofen (ADVIL,MOTRIN) 200 MG tablet Take 400 mg by mouth every 8 (eight) hours as needed.     [provider]  lisinopril (ZESTRIL) 10 MG tablet Take 1 tablet (10 mg total) by mouth daily. 09/15/19   Elige Radon, MD  MAGNESIUM PO Take 200 mg by mouth at bedtime. 1 teaspoon (200 mg)    [provider]  potassium chloride (KLOR-CON) 10 MEQ tablet Take 10 mEq by mouth every evening. 09/17/19   [provider]     ALLERGY: Allergies  Allergen Reactions  . Chlorthalidone     Dizzy spells and electrolyte abnormalities  . Prednisone     Stroke like symptoms   . Bee Venom Swelling and Rash    Social History   Tobacco Use  . Smoking status: Current Every Day Smoker    Packs/day: 1.00    Years: 35.00    Pack years: 35.00    Types: Cigarettes  . Smokeless tobacco: Never Used  Substance Use Topics  . Alcohol use: No     Family History  Problem Relation Age of Onset  . Hypertension Father   . Cancer Father   . Cancer Mother      ROS   ROS  Exam   There were no vitals filed for this visit. General appearance: WDWN, NAD Eyes: No scleral injection Cardiovascular: Regular rate and rhythm without murmurs, rubs, gallops. No edema or variciosities. Distal pulses normal. Pulmonary: Effort normal, non-labored breathing Musculoskeletal:     Muscle tone upper extremities: Normal    Muscle tone lower extremities: Normal    Motor exam: Upper Extremities Deltoid Bicep Tricep Grip  Right 5/5 5/5 5/5 5/5  Left 5/5 5/5 5/5 5/5   Lower Extremity IP Quad PF DF EHL  Right 5/5 5/5 5/5 5/5 5/5  Left 5/5 5/5 5/5 5/5 5/5   Neurological Mental Status:    - Patient is awake, alert, oriented to person, place, month, year, and situation    - Patient is able to give a clear and coherent history.    - No signs of aphasia or neglect Cranial Nerves    - II: Visual Fields are full. PERRL    - III/IV/VI: EOMI without ptosis or diploplia.     - V: Facial sensation is grossly normal    - VII: Facial movement is symmetric.     - VIII: hearing is intact to voice    - X: Uvula elevates symmetrically    - XI: Shoulder shrug is symmetric.    - XII: tongue is midline without atrophy or fasciculations.  Sensory: Sensation grossly intact to LT  Results - Imaging/Labs   Results for orders placed or performed during the hospital encounter of 05/20/20 (from the past 48 hour(s))  SARS CORONAVIRUS 2 (TAT  6-24 HRS) Nasopharyngeal Nasopharyngeal Swab     Status: None   Collection Time: 05/20/20  9:55 AM   Specimen: Nasopharyngeal Swab  Result Value Ref Range   SARS Coronavirus 2 NEGATIVE NEGATIVE    Comment: (NOTE) SARS-CoV-2 target nucleic acids are NOT DETECTED.  The SARS-CoV-2 RNA is generally detectable in upper and lower respiratory specimens during the acute  phase of infection. Negative results do not preclude SARS-CoV-2 infection, do not rule out co-infections with other pathogens, and should not be used as the sole basis for treatment or other patient management decisions. Negative results must be combined with clinical observations, patient history, and epidemiological information. The expected result is Negative.  Fact Sheet for Patients: HairSlick.no  Fact Sheet for Healthcare Providers: quierodirigir.com  This test is not yet approved or cleared by the [place] FDA and  has been authorized for detection and/or diagnosis of SARS-CoV-2 by FDA under an Emergency Use Authorization (EUA). This EUA will remain  in effect (meaning this test can be used) for the duration of the COVID-19 declaration under Se ction 564(b)(1) of the Act, 21 U.S.C. section 360bbb-3(b)(1), unless the authorization is terminated or revoked sooner.  Performed at [facility], [address] [place], [place] [address]     No results found.  Impression/Plan   59 y.o. female  six months status post surpass embolization of the right carotid aneurysm.  She has been taking aspirin and Plavix daily.  We will proceed with diagnostic cerebral angiogram for routine surveillance.   We have reviewed the indications for the procedure including the associated risks, benefits and alternatives at length.  All questions today were answered and consent was obtained.  Lisbeth Renshaw, MD [facility]

## 2020-05-22 ENCOUNTER — Ambulatory Visit (HOSPITAL_COMMUNITY)
Admission: RE | Admit: 2020-05-22 | Discharge: 2020-05-22 | Disposition: A | Discharge status: Home / Self Care | Payer: 59 | Source: Ambulatory Visit | Attending: Neurosurgery | Admitting: Neurosurgery

## 2020-05-22 ENCOUNTER — Other Ambulatory Visit: Payer: Self-pay

## 2020-05-22 ENCOUNTER — Other Ambulatory Visit: Payer: Self-pay | Admitting: Neurosurgery

## 2020-05-22 DIAGNOSIS — I671 Cerebral aneurysm, nonruptured: Secondary | ICD-10-CM

## 2020-05-22 DIAGNOSIS — Z79899 Other long term (current) drug therapy: Secondary | ICD-10-CM | POA: Diagnosis not present

## 2020-05-22 DIAGNOSIS — F17210 Nicotine dependence, cigarettes, uncomplicated: Secondary | ICD-10-CM | POA: Insufficient documentation

## 2020-05-22 DIAGNOSIS — Z7982 Long term (current) use of aspirin: Secondary | ICD-10-CM | POA: Insufficient documentation

## 2020-05-22 DIAGNOSIS — I10 Essential (primary) hypertension: Secondary | ICD-10-CM | POA: Insufficient documentation

## 2020-05-22 DIAGNOSIS — Z888 Allergy status to other drugs, medicaments and biological substances status: Secondary | ICD-10-CM | POA: Diagnosis not present

## 2020-05-22 DIAGNOSIS — Z7902 Long term (current) use of antithrombotics/antiplatelets: Secondary | ICD-10-CM | POA: Diagnosis not present

## 2020-05-22 HISTORY — PX: IR ANGIO INTRA EXTRACRAN SEL INTERNAL CAROTID UNI R MOD SED: IMG5362

## 2020-05-22 LAB — CBC WITH DIFFERENTIAL/PLATELET
Abs Immature Granulocytes: 0.04 10*3/uL (ref 0.00–0.07)
Basophils Absolute: 0.1 10*3/uL (ref 0.0–0.1)
Basophils Relative: 1 %
Eosinophils Absolute: 0.1 10*3/uL (ref 0.0–0.5)
Eosinophils Relative: 2 %
HCT: 30.8 % — ABNORMAL LOW (ref 36.0–46.0)
Hemoglobin: 9.5 g/dL — ABNORMAL LOW (ref 12.0–15.0)
Immature Granulocytes: 0 %
Lymphocytes Relative: 28 %
Lymphs Abs: 2.5 10*3/uL (ref 0.7–4.0)
MCH: 31.6 pg (ref 26.0–34.0)
MCHC: 30.8 g/dL (ref 30.0–36.0)
MCV: 102.3 fL — ABNORMAL HIGH (ref 80.0–100.0)
Monocytes Absolute: 0.8 10*3/uL (ref 0.1–1.0)
Monocytes Relative: 9 %
Neutro Abs: 5.5 10*3/uL (ref 1.7–7.7)
Neutrophils Relative %: 60 %
Platelets: 497 10*3/uL — ABNORMAL HIGH (ref 150–400)
RBC: 3.01 MIL/uL — ABNORMAL LOW (ref 3.87–5.11)
RDW: 12.8 % (ref 11.5–15.5)
WBC: 9.1 10*3/uL (ref 4.0–10.5)
nRBC: 0 % (ref 0.0–0.2)

## 2020-05-22 LAB — BASIC METABOLIC PANEL
Anion gap: 13 (ref 5–15)
BUN: 11 mg/dL (ref 6–20)
CO2: 38 mmol/L — ABNORMAL HIGH (ref 22–32)
Calcium: 9.2 mg/dL (ref 8.9–10.3)
Chloride: 90 mmol/L — ABNORMAL LOW (ref 98–111)
Creatinine, Ser: 0.71 mg/dL (ref 0.44–1.00)
GFR calc Af Amer: >60 mL/min (ref >60)
GFR calc non Af Amer: >60 mL/min (ref >60)
Glucose, Bld: 113 mg/dL — ABNORMAL HIGH (ref 70–99)
Potassium: 2.7 mmol/L — CL (ref 3.5–5.1)
Sodium: 141 mmol/L (ref 135–145)

## 2020-05-22 LAB — APTT: aPTT: 33 seconds (ref 24–36)

## 2020-05-22 LAB — PROTIME-INR
INR: 0.9 (ref 0.8–1.2)
Prothrombin Time: 11.8 seconds (ref 11.4–15.2)

## 2020-05-22 MED ORDER — FENTANYL CITRATE (PF) 100 MCG/2ML IJ SOLN
INTRAMUSCULAR | Status: AC
  Filled 2020-05-22: qty 2

## 2020-05-22 MED ORDER — SODIUM CHLORIDE 0.9 % IV SOLN: INTRAVENOUS | Status: DC

## 2020-05-22 MED ORDER — HEPARIN SODIUM (PORCINE) 1000 UNIT/ML IJ SOLN
INTRAMUSCULAR | Status: AC
  Filled 2020-05-22: qty 1

## 2020-05-22 MED ORDER — FENTANYL CITRATE (PF) 100 MCG/2ML IJ SOLN
INTRAMUSCULAR | Status: AC | PRN
  Administered 2020-05-22: 25 ug via INTRAVENOUS

## 2020-05-22 MED ORDER — HYDROCODONE-ACETAMINOPHEN 5-325 MG PO TABS: 1.0000 | ORAL_TABLET | ORAL | Status: DC | PRN

## 2020-05-22 MED ORDER — LIDOCAINE HCL (PF) 1 % IJ SOLN
INTRAMUSCULAR | Status: AC | PRN
  Administered 2020-05-22: 5 mL

## 2020-05-22 MED ORDER — HEPARIN SODIUM (PORCINE) 1000 UNIT/ML IJ SOLN
INTRAMUSCULAR | Status: AC | PRN
  Administered 2020-05-22: 2000 [IU] via INTRAVENOUS

## 2020-05-22 MED ORDER — MIDAZOLAM HCL 2 MG/2ML IJ SOLN
INTRAMUSCULAR | Status: AC | PRN
  Administered 2020-05-22 (×2): 0.5 mg via INTRAVENOUS

## 2020-05-22 MED ORDER — MIDAZOLAM HCL 2 MG/2ML IJ SOLN
INTRAMUSCULAR | Status: AC
  Filled 2020-05-22: qty 2

## 2020-05-22 MED ORDER — LIDOCAINE HCL 1 % IJ SOLN
INTRAMUSCULAR | Status: AC
  Filled 2020-05-22: qty 20

## 2020-05-22 MED ORDER — IOHEXOL 300 MG/ML  SOLN
100.0000 mL | Freq: Once | INTRAMUSCULAR | Status: AC | PRN
  Administered 2020-05-22: 20 mL via INTRA_ARTERIAL

## 2020-05-22 MED ORDER — POTASSIUM CHLORIDE CRYS ER 20 MEQ PO TBCR
40.0000 meq | EXTENDED_RELEASE_TABLET | Freq: Once | ORAL | Status: AC
  Administered 2020-05-22: 40 meq via ORAL
  Filled 2020-05-22: qty 2

## 2020-05-22 NOTE — Sedation Documentation (Signed)
Patient transported to short stay. Transferred to bed. Groin site assessed, soft to palpation with no hematoma noted. +2 distal pulses intact

## 2020-05-22 NOTE — Discharge Instructions (Signed)
Angiogram, Care After This sheet gives you information about how to care for yourself after your procedure. Your health care provider may also give you more specific instructions. If you have problems or questions, contact your health care provider. What can I expect after the procedure? After the procedure, it is common to have bruising and tenderness at the catheter insertion area. Follow these instructions at home: Insertion site care  Follow instructions from your health care provider about how to take care of your insertion site. Make sure you: ? Wash your hands with soap and water before you change your bandage (dressing). If soap and water are not available, use hand sanitizer. ? Change your dressing as told by your health care provider. ? Leave stitches (sutures), skin glue, or adhesive strips in place. These skin closures may need to stay in place for 2 weeks or longer. If adhesive strip edges start to loosen and curl up, you may trim the loose edges. Do not remove adhesive strips completely unless your health care provider tells you to do that.  Do not take baths, swim, or use a hot tub until your health care provider approves.  You may shower 24-48 hours after the procedure or as told by your health care provider. ? Gently wash the site with plain soap and water. ? Pat the area dry with a clean towel. ? Do not rub the site. This may cause bleeding.  Do not apply powder or lotion to the site. Keep the site clean and dry.  Check your insertion site every day for signs of infection. Check for: ? Redness, swelling, or pain. ? Fluid or blood. ? Warmth. ? Pus or a bad smell. Activity  Rest as told by your health care provider, usually for 1-2 days.  Do not lift anything that is heavier than 10 lbs. (4.5 kg) or as told by your health care provider.  Do not drive for 24 hours if you were given a medicine to help you relax (sedative).  Do not drive or use heavy machinery while  taking prescription pain medicine. General instructions   Return to your normal activities as told by your health care provider, usually in about a week. Ask your health care provider what activities are safe for you.  If the catheter site starts bleeding, lie flat and put pressure on the site. If the bleeding does not stop, get help right away. This is a medical emergency.  Drink enough fluid to keep your urine clear or pale yellow. This helps flush the contrast dye from your body.  Take over-the-counter and prescription medicines only as told by your health care provider.  Keep all follow-up visits as told by your health care provider. This is important. Contact a health care provider if:  You have a fever or chills.  You have redness, swelling, or pain around your insertion site.  You have fluid or blood coming from your insertion site.  The insertion site feels warm to the touch.  You have pus or a bad smell coming from your insertion site.  You have bruising around the insertion site.  You notice blood collecting in the tissue around the catheter site (hematoma). The hematoma may be painful to the touch. Get help right away if:  You have severe pain at the catheter insertion area.  The catheter insertion area swells very fast.  The catheter insertion area is bleeding, and the bleeding does not stop when you hold steady pressure on the area.    The area near or just beyond the catheter insertion site becomes pale, cool, tingly, or numb. These symptoms may represent a serious problem that is an emergency. Do not wait to see if the symptoms will go away. Get medical help right away. Call your local emergency services (911 in the [place] Do not drive yourself to the hospital. Summary  After the procedure, it is common to have bruising and tenderness at the catheter insertion area.  After the procedure, it is important to rest and drink plenty of fluids.  Do not take baths,  swim, or use a hot tub until your health care provider says it is okay to do so. You may shower 24-48 hours after the procedure or as told by your health care provider.  If the catheter site starts bleeding, lie flat and put pressure on the site. If the bleeding does not stop, get help right away. This is a medical emergency. This information is not intended to replace advice given to you by your health care provider. Make sure you discuss any questions you have with your health care provider. Document Revised: 10/14/2017 Document Reviewed: 10/06/2016 Elsevier Patient Education  2020 [employer]  

## 2020-05-22 NOTE — Brief Op Note (Signed)
  NEUROSURGERY BRIEF OPERATIVE  NOTE   PREOP DX: RICA aneurysm  POSTOP DX: Same  PROCEDURE: Diagnostic cerebral angiogram  SURGEON: Dr. Lisbeth Renshaw, MD  ANESTHESIA: IV Sedation with Local  EBL: Minimal  SPECIMENS: None  COMPLICATIONS: None  CONDITION: Stable to recovery  FINDINGS (Full report in [facility]): 1. Complete occlusion of RICA aneurysm 17months after treatment with Surpass device. No in-stent stenosis.

## 2020-08-11 ENCOUNTER — Observation Stay (HOSPITAL_COMMUNITY): Payer: 59

## 2020-08-11 ENCOUNTER — Observation Stay (HOSPITAL_COMMUNITY)
Admission: EM | Admit: 2020-08-11 | Discharge: 2020-08-11 | Disposition: A | Discharge status: Home / Self Care | Payer: 59 | Attending: Family Medicine | Admitting: Family Medicine

## 2020-08-11 ENCOUNTER — Other Ambulatory Visit: Payer: Self-pay

## 2020-08-11 ENCOUNTER — Encounter (HOSPITAL_COMMUNITY): Payer: Self-pay | Admitting: *Deleted

## 2020-08-11 DIAGNOSIS — Z20822 Contact with and (suspected) exposure to covid-19: Secondary | ICD-10-CM | POA: Diagnosis not present

## 2020-08-11 DIAGNOSIS — J45909 Unspecified asthma, uncomplicated: Secondary | ICD-10-CM | POA: Diagnosis not present

## 2020-08-11 DIAGNOSIS — R0689 Other abnormalities of breathing: Secondary | ICD-10-CM

## 2020-08-11 DIAGNOSIS — I10 Essential (primary) hypertension: Principal | ICD-10-CM | POA: Insufficient documentation

## 2020-08-11 DIAGNOSIS — R350 Frequency of micturition: Secondary | ICD-10-CM | POA: Insufficient documentation

## 2020-08-11 DIAGNOSIS — F17210 Nicotine dependence, cigarettes, uncomplicated: Secondary | ICD-10-CM | POA: Insufficient documentation

## 2020-08-11 DIAGNOSIS — I159 Secondary hypertension, unspecified: Secondary | ICD-10-CM

## 2020-08-11 DIAGNOSIS — Z79899 Other long term (current) drug therapy: Secondary | ICD-10-CM | POA: Diagnosis not present

## 2020-08-11 DIAGNOSIS — R519 Headache, unspecified: Secondary | ICD-10-CM | POA: Insufficient documentation

## 2020-08-11 DIAGNOSIS — R002 Palpitations: Secondary | ICD-10-CM | POA: Diagnosis not present

## 2020-08-11 LAB — RESPIRATORY PANEL BY RT PCR (FLU A&B, COVID)
Influenza A by PCR: NEGATIVE
Influenza B by PCR: NEGATIVE
SARS Coronavirus 2 by RT PCR: NEGATIVE

## 2020-08-11 LAB — CBC WITH DIFFERENTIAL/PLATELET
Abs Immature Granulocytes: 0.03 10*3/uL (ref 0.00–0.07)
Basophils Absolute: 0.1 10*3/uL (ref 0.0–0.1)
Basophils Relative: 1 %
Eosinophils Absolute: 0.1 10*3/uL (ref 0.0–0.5)
Eosinophils Relative: 1 %
HCT: 40.6 % (ref 36.0–46.0)
Hemoglobin: 12.4 g/dL (ref 12.0–15.0)
Immature Granulocytes: 0 %
Lymphocytes Relative: 26 %
Lymphs Abs: 2.7 10*3/uL (ref 0.7–4.0)
MCH: 27.9 pg (ref 26.0–34.0)
MCHC: 30.5 g/dL (ref 30.0–36.0)
MCV: 91.4 fL (ref 80.0–100.0)
Monocytes Absolute: 0.8 10*3/uL (ref 0.1–1.0)
Monocytes Relative: 8 %
Neutro Abs: 6.8 10*3/uL (ref 1.7–7.7)
Neutrophils Relative %: 64 %
Platelets: 383 10*3/uL (ref 150–400)
RBC: 4.44 MIL/uL (ref 3.87–5.11)
RDW: 16.9 % — ABNORMAL HIGH (ref 11.5–15.5)
WBC: 10.5 10*3/uL (ref 4.0–10.5)
nRBC: 0 % (ref 0.0–0.2)

## 2020-08-11 LAB — BASIC METABOLIC PANEL
Anion gap: 12 (ref 5–15)
BUN: 23 mg/dL — ABNORMAL HIGH (ref 6–20)
CO2: 30 mmol/L (ref 22–32)
Calcium: 13.8 mg/dL (ref 8.9–10.3)
Chloride: 91 mmol/L — ABNORMAL LOW (ref 98–111)
Creatinine, Ser: 0.92 mg/dL (ref 0.44–1.00)
GFR calc Af Amer: >60 mL/min (ref >60)
GFR calc non Af Amer: >60 mL/min (ref >60)
Glucose, Bld: 116 mg/dL — ABNORMAL HIGH (ref 70–99)
Potassium: 3.3 mmol/L — ABNORMAL LOW (ref 3.5–5.1)
Sodium: 133 mmol/L — ABNORMAL LOW (ref 135–145)

## 2020-08-11 LAB — URINALYSIS, ROUTINE W REFLEX MICROSCOPIC
Bilirubin Urine: NEGATIVE
Glucose, UA: NEGATIVE mg/dL
Hgb urine dipstick: NEGATIVE
Ketones, ur: NEGATIVE mg/dL
Leukocytes,Ua: NEGATIVE
Nitrite: NEGATIVE
Protein, ur: NEGATIVE mg/dL
Specific Gravity, Urine: 1.029 (ref 1.005–1.030)
pH: 7 (ref 5.0–8.0)

## 2020-08-11 LAB — TSH: TSH: 1.904 u[IU]/mL (ref 0.350–4.500)

## 2020-08-11 MED ORDER — ONDANSETRON HCL 4 MG/2ML IJ SOLN: 4.0000 mg | Freq: Four times a day (QID) | INTRAMUSCULAR | Status: DC | PRN

## 2020-08-11 MED ORDER — LABETALOL HCL 5 MG/ML IV SOLN: 10.0000 mg | INTRAVENOUS | Status: DC | PRN

## 2020-08-11 MED ORDER — SODIUM CHLORIDE 0.9 % IV BOLUS
1000.0000 mL | Freq: Once | INTRAVENOUS | Status: AC
  Administered 2020-08-11: 1000 mL via INTRAVENOUS

## 2020-08-11 MED ORDER — SODIUM CHLORIDE 0.9 % IV SOLN: 250.0000 mL | INTRAVENOUS | Status: DC | PRN

## 2020-08-11 MED ORDER — DIPHENHYDRAMINE HCL 25 MG PO CAPS: 25.0000 mg | ORAL_CAPSULE | Freq: Three times a day (TID) | ORAL | Status: DC | PRN

## 2020-08-11 MED ORDER — MAGNESIUM OXIDE 400 (241.3 MG) MG PO TABS: 200.0000 mg | ORAL_TABLET | Freq: Every day | ORAL | Status: DC

## 2020-08-11 MED ORDER — HYDROCODONE-ACETAMINOPHEN 5-325 MG PO TABS
2.0000 | ORAL_TABLET | Freq: Once | ORAL | Status: AC
  Administered 2020-08-11: 2 via ORAL
  Filled 2020-08-11: qty 2

## 2020-08-11 MED ORDER — HEPARIN SODIUM (PORCINE) 5000 UNIT/ML IJ SOLN
5000.0000 [IU] | Freq: Three times a day (TID) | INTRAMUSCULAR | Status: DC
  Filled 2020-08-11: qty 1

## 2020-08-11 MED ORDER — ASCORBIC ACID 500 MG PO TABS
5000.0000 mg | ORAL_TABLET | Freq: Every day | ORAL | Status: DC
  Filled 2020-08-11: qty 10

## 2020-08-11 MED ORDER — MORPHINE SULFATE (PF) 4 MG/ML IV SOLN
4.0000 mg | Freq: Once | INTRAVENOUS | Status: AC
  Administered 2020-08-11: 4 mg via INTRAVENOUS
  Filled 2020-08-11: qty 1

## 2020-08-11 MED ORDER — POLYETHYLENE GLYCOL 3350 17 G PO PACK: 17.0000 g | PACK | Freq: Every day | ORAL | Status: DC | PRN

## 2020-08-11 MED ORDER — IOHEXOL 350 MG/ML SOLN
100.0000 mL | Freq: Once | INTRAVENOUS | Status: AC | PRN
  Administered 2020-08-11: 100 mL via INTRAVENOUS

## 2020-08-11 MED ORDER — CEFAZOLIN SODIUM-DEXTROSE 2-4 GM/100ML-% IV SOLN: 2.0000 g | INTRAVENOUS | Status: DC

## 2020-08-11 MED ORDER — AMLODIPINE BESYLATE 5 MG PO TABS
10.0000 mg | ORAL_TABLET | Freq: Every day | ORAL | Status: DC
  Administered 2020-08-11: 10 mg via ORAL
  Filled 2020-08-11: qty 2

## 2020-08-11 MED ORDER — ACETAMINOPHEN 500 MG PO TABS: 500.0000 mg | ORAL_TABLET | Freq: Four times a day (QID) | ORAL | Status: DC | PRN

## 2020-08-11 MED ORDER — ONDANSETRON HCL 4 MG PO TABS: 4.0000 mg | ORAL_TABLET | Freq: Four times a day (QID) | ORAL | Status: DC | PRN

## 2020-08-11 MED ORDER — SODIUM CHLORIDE 0.9% FLUSH: 3.0000 mL | INTRAVENOUS | Status: DC | PRN

## 2020-08-11 MED ORDER — ASPIRIN EC 325 MG PO TBEC
325.0000 mg | DELAYED_RELEASE_TABLET | Freq: Every day | ORAL | Status: DC
  Filled 2020-08-11: qty 1

## 2020-08-11 MED ORDER — CHLORHEXIDINE GLUCONATE CLOTH 2 % EX PADS: 6.0000 | MEDICATED_PAD | Freq: Once | CUTANEOUS | Status: DC

## 2020-08-11 MED ORDER — HYDRALAZINE HCL 25 MG PO TABS
100.0000 mg | ORAL_TABLET | Freq: Three times a day (TID) | ORAL | Status: DC
  Administered 2020-08-11: 100 mg via ORAL
  Filled 2020-08-11: qty 4

## 2020-08-11 MED ORDER — CLONIDINE HCL 0.1 MG PO TABS: 0.1000 mg | ORAL_TABLET | Freq: Three times a day (TID) | ORAL | Status: DC | PRN

## 2020-08-11 MED ORDER — TRAZODONE HCL 50 MG PO TABS: 150.0000 mg | ORAL_TABLET | Freq: Every day | ORAL | Status: DC

## 2020-08-11 MED ORDER — SODIUM CHLORIDE 0.9% FLUSH: 3.0000 mL | Freq: Two times a day (BID) | INTRAVENOUS | Status: DC

## 2020-08-11 MED ORDER — CLONIDINE HCL 0.1 MG PO TABS
0.1000 mg | ORAL_TABLET | Freq: Two times a day (BID) | ORAL | Status: DC
  Administered 2020-08-11: 0.1 mg via ORAL
  Filled 2020-08-11: qty 1

## 2020-08-11 MED ORDER — VITAMIN D 25 MCG (1000 UNIT) PO TABS: 2000.0000 [IU] | ORAL_TABLET | Freq: Every day | ORAL | Status: DC

## 2020-08-11 MED ORDER — ONDANSETRON 4 MG PO TBDP
4.0000 mg | ORAL_TABLET | Freq: Once | ORAL | Status: AC
  Administered 2020-08-11: 4 mg via ORAL
  Filled 2020-08-11: qty 1

## 2020-08-11 MED ORDER — SODIUM CHLORIDE 0.9 % IV SOLN: INTRAVENOUS | Status: DC

## 2020-08-11 MED ORDER — HYDROCODONE-ACETAMINOPHEN 10-325 MG PO TABS: 1.0000 | ORAL_TABLET | Freq: Four times a day (QID) | ORAL | Status: DC | PRN

## 2020-08-11 NOTE — ED Notes (Signed)
Date and time results received: 08/11/20 1401 (use smartphrase ".now" to insert current time)  Test: Calcium Critical Value:13.8  Name of Provider Notified: Dr. Hyacinth Meeker  Orders Received? Or Actions Taken?:

## 2020-08-11 NOTE — ED Triage Notes (Signed)
Pt states her BP today has been 240/140's and states she has been having heart palpitations and feels weak; pt c/o headache

## 2020-08-11 NOTE — ED Provider Notes (Addendum)
[facility] EMERGENCY DEPARTMENT Provider Note   CSN: [id number] Arrival date & time: 08/11/20  1003     History Chief Complaint  Patient presents with  . Hypertension    Darlene [name] is a 59 y.o. female.  HPI   This patient is a 59 year old female, she presents to the hospital today with a complaint of hypertension associated with some palpitations and urinary frequency along with a headache.  The patient reports that she recently had coiling of an aneurysm, she has done very well, she has noticed some fluctuations in swinging of her blood pressure very high and back to normal, she is on medications for this however reports that she continues to have episodes of severe hypertension.  This morning she measured 240/140, at this time she is 204/107.  She denies changes in her vision, she is not actively having chest pain or palpitations.  She took her blood pressure medicine this morning and her blood pressure has improved.  She has been on multiple medications in the past and is frustrated by the lack of significant ongoing improvement.  Currently she takes a combination of hydralazine and amlodipine.  She has not had a recent change in these medications.  Past Medical History:  Diagnosis Date  . Anemia   . Arthritis   . Asthma    Activity induced  . Essential hypertension, benign   . Family history of adverse reaction to anesthesia    Mom and daughter had difficulty waking up post surgery.  Mother's body temperature dropped during and after surgery.   . Headache   . History of vertigo   . Impaired fasting glucose   . Thyroid nodule     Patient Active Problem List   Diagnosis Date Noted  . Hypercalcemia 08/11/2020  . Cerebral aneurysm, nonruptured 11/27/2019  . Hypertensive urgency 09/14/2019  . Hypokalemia   . 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 Palpitations 07/24/2013  . Precordial pain 07/24/2013  . Essential hypertension, benign  05/20/2013  . Tobacco abuse 05/20/2013    Past Surgical History:  Procedure Laterality Date  . ABDOMINAL HYSTERECTOMY    . CHOLECYSTECTOMY OPEN    . IR ANGIO INTRA EXTRACRAN SEL INTERNAL CAROTID BILAT MOD SED  10/16/2019  . IR ANGIO INTRA EXTRACRAN SEL INTERNAL CAROTID UNI R MOD SED  11/27/2019  . IR ANGIO INTRA EXTRACRAN SEL INTERNAL CAROTID UNI R MOD SED  05/22/2020  . IR ANGIO VERTEBRAL SEL VERTEBRAL BILAT MOD SED  10/16/2019  . IR ANGIOGRAM FOLLOW UP STUDY  11/27/2019  . IR TRANSCATH/EMBOLIZ  11/27/2019  . Left hand surgery    . PLACEMENT OF BREAST IMPLANTS    . RADIOLOGY WITH ANESTHESIA N/A 11/27/2019   Procedure: Surpass embolization of right carotid aneurysm;  Surgeon: Lisbeth Renshaw, MD;  Location: [facility] OR;  Service: Radiology;  Laterality: N/A;     OB History   No obstetric history on file.     Family History  Problem Relation Age of Onset  . Hypertension Father   . Cancer Father   . Cancer Mother     Social History   Tobacco Use  . Smoking status: Current Every Day Smoker    Packs/day: 1.00    Years: 35.00    Pack years: 35.00    Types: Cigarettes  . Smokeless tobacco: Never Used  Vaping Use  . Vaping Use: Never used  Substance Use Topics  . Alcohol use: No  . Drug use: Yes  Types: Hydromorphone    Home Medications Prior to Admission medications   Medication Sig Start Date End Date Taking? Authorizing Provider  acetaminophen (TYLENOL) 500 MG tablet Take 500 mg by mouth every 6 (six) hours as needed (pain.). For pain     [provider]  amLODipine (NORVASC) 2.5 MG tablet Take 2.5 mg by mouth every evening.    [provider]  Ascorbic Acid (VITAMIN C) 1000 MG tablet Take 5,000 mg by mouth daily.     [provider]  aspirin EC 325 MG tablet Take 325 mg by mouth daily.    [provider]  cholecalciferol (VITAMIN D) 25 MCG (1000 UT) tablet Take 2,000 Units by mouth at bedtime.    [provider]  clopidogrel  (PLAVIX) 75 MG tablet Take 75 mg by mouth at bedtime.    [provider]  diphenhydrAMINE (BENADRYL) 25 mg capsule Take 25 mg by mouth 2 (two) times daily as needed. For allergies     [provider]  escitalopram (LEXAPRO) 5 MG tablet Take 5 mg by mouth at bedtime.    [provider]  hydrALAZINE (APRESOLINE) 25 MG tablet Take 25 mg by mouth 2 (two) times daily.    [provider]  HYDROcodone-acetaminophen (NORCO) 10-325 MG tablet Take 1 tablet by mouth every 6 (six) hours as needed for pain. 09/07/19   [provider]  ibuprofen (ADVIL,MOTRIN) 200 MG tablet Take 400 mg by mouth every 8 (eight) hours as needed.     [provider]  lisinopril (ZESTRIL) 10 MG tablet Take 1 tablet (10 mg total) by mouth daily. 09/15/19   Elige Radonhristian, Rylee, MD  MAGNESIUM PO Take 200 mg by mouth at bedtime. 1 teaspoon (200 mg)    [provider]  potassium chloride (KLOR-CON) 10 MEQ tablet Take 10 mEq by mouth every evening. 09/17/19   [provider]    Allergies    Chlorthalidone, Prednisone, and Bee venom  Review of Systems   Review of Systems  All other systems reviewed and are negative.   Physical Exam Updated Vital Signs BP (!) 196/104 (BP Location: Right Arm)   Pulse 98   Temp 98.2 F (36.8 C) (Oral)   Resp 18   Ht 1.6 m (5\' 3" )   Wt 47.6 kg   SpO2 97%   BMI 18.60 kg/m   Physical Exam Vitals and nursing note reviewed.  Constitutional:      General: She is not in acute distress.    Appearance: She is well-developed.  HENT:     Head: Normocephalic and atraumatic.     Mouth/Throat:     Pharynx: No oropharyngeal exudate.  Eyes:     General: No scleral icterus.       Right eye: No discharge.        Left eye: No discharge.     Conjunctiva/sclera: Conjunctivae normal.     Pupils: Pupils are equal, round, and reactive to light.  Neck:     Thyroid: No thyromegaly.     Vascular: No JVD.  Cardiovascular:     Rate and  Rhythm: Normal rate and regular rhythm.     Heart sounds: Normal heart sounds. No murmur heard.  No friction rub. No gallop.   Pulmonary:     Effort: Pulmonary effort is normal. No respiratory distress.     Breath sounds: Normal breath sounds. No wheezing or rales.  Abdominal:     General: Bowel sounds are normal. There is no  distension.     Palpations: Abdomen is soft. There is no mass.     Tenderness: There is no abdominal tenderness.  Musculoskeletal:        General: No tenderness. Normal range of motion.     Cervical back: Normal range of motion and neck supple.  Lymphadenopathy:     Cervical: No cervical adenopathy.  Skin:    General: Skin is warm and dry.     Findings: No erythema or rash.  Neurological:     Mental Status: She is alert.     Coordination: Coordination normal.     Comments: Speech is clear, cranial nerves III through XII are intact, memory is intact, strength is normal in all 4 extremities including grips, sensation is intact to light touch and pinprick in all 4 extremities. Coordination as tested by finger-nose-finger is normal, no limb ataxia. Normal gait, normal reflexes at the patellar tendons bilaterally  Psychiatric:        Behavior: Behavior normal.     ED Results / Procedures / Treatments   Labs (all labs ordered are listed, but only abnormal results are displayed) Labs Reviewed  CBC WITH DIFFERENTIAL/PLATELET - Abnormal; Notable for the following components:      Result Value   RDW 16.9 (*)    All other components within normal limits  BASIC METABOLIC PANEL - Abnormal; Notable for the following components:   Sodium 133 (*)    Potassium 3.3 (*)    Chloride 91 (*)    Glucose, Bld 116 (*)    BUN 23 (*)    Calcium 13.8 (*)    All other components within normal limits  RESPIRATORY PANEL BY RT PCR (FLU A&B, COVID)  URINALYSIS, ROUTINE W REFLEX MICROSCOPIC  TSH  PTH, INTACT AND CALCIUM    EKG EKG Interpretation  Date/Time:  Monday August 11 2020 14:14:33 EDT Ventricular Rate:  91 PR Interval:  122 QRS Duration: 80 QT Interval:  354 QTC Calculation: 435 R Axis:   84 Text Interpretation: Normal sinus rhythm Right atrial enlargement Borderline ECG QT has shortened Confirmed by Eber Hong ([id number]) on 08/11/2020 2:21:47 PM   Radiology No results found.  Procedures .Critical Care Performed by: Eber Hong, MD Authorized by: Eber Hong, MD   Critical care provider statement:    Critical care time (minutes):  35   Critical care time was exclusive of:  Separately billable procedures and treating other patients and teaching time   Critical care was necessary to treat or prevent imminent or life-threatening deterioration of the following conditions:  Endocrine crisis   Critical care was time spent personally by me on the following activities:  Blood draw for specimens, development of treatment plan with patient or surrogate, discussions with consultants, evaluation of patient's response to treatment, examination of patient, obtaining history from patient or surrogate, ordering and performing treatments and interventions, ordering and review of laboratory studies, ordering and review of radiographic studies, pulse oximetry, re-evaluation of patient's condition and review of old charts Comments:          (including critical care time)  Medications Ordered in ED Medications  sodium chloride 0.9 % bolus 1,000 mL (has no administration in time range)  morphine 4 MG/ML injection 4 mg (has no administration in time range)  labetalol (NORMODYNE) injection 10 mg (has no administration in time range)  amLODipine (NORVASC) tablet 10 mg (has no administration in time range)  hydrALAZINE (APRESOLINE) tablet 100 mg (has no administration in time range)  cloNIDine (CATAPRES) tablet 0.1 mg (has no administration in time range)  0.9 %  sodium chloride infusion (has no administration in time range)  HYDROcodone-acetaminophen  (NORCO/VICODIN) 5-325 MG per tablet 2 tablet (2 tablets Oral Given 08/11/20 1328)  ondansetron (ZOFRAN-ODT) disintegrating tablet 4 mg (4 mg Oral Given 08/11/20 1329)    ED Course  I have reviewed the triage vital signs and the nursing notes.  Pertinent labs & imaging results that were available during my care of the patient were reviewed by me and considered in my medical decision making (see chart for details).    MDM Rules/Calculators/A&P                          This patient appears well, her blood pressure is improved from what it was earlier but still elevated.  We will recheck, will check renal function, she states she has not had a renal ultrasound which may be beneficial given her erratic blood pressure measurements.  She does no neurologic symptoms, no headache, no chest pain, no palpitations actively.  Patient is agreeable to the plan, she will get her daily dose of hydrocodone which she did not take this morning for chronic back pain.    Pain medicines given - pt reports having ongoing diffuse weakness, some muscle spasms in the hands - some tingling in the extremities - generalized weakness, and now ongoing headaches - she has been evaluated multiple times in the past through many years and reports never having high Ca, though she has had abnormalities of sodium and K in the past -   Today the Ca is 13.8, very abnormal, in fact borderline severe Hypercalcemia and in need of some treatments.  She has never had thyroid dysfunction - will check TSH, PTH and d/w hospitalist for admission.  Her Na and K today are borderline c/w her chronic numbers. (always a little low)  I discussed the care with Dr. Mariea Clonts who will admit the patient to hospital.  Final Clinical Impression(s) / ED Diagnoses Final diagnoses:  Hypercalcemia  Secondary hypertension     Eber Hong, MD 08/11/20 1445    Eber Hong, MD 08/11/20 1446

## 2020-08-11 NOTE — Progress Notes (Signed)
  Got a call from RN that pt is requesting to leave AMA -- -EDP had recommended admission, additional testing including CT head CT a abdomen with renal artery protocol were ordered and pending  - -I was able to speak to patient by phone, despite explaining to her that she had elevated calcium needed IV fluids and possibly calcitonin/pamidronate, as well as further work-up to identify the etiology of her hypercalcemia--, also explained to patient findings of CTA abdomen including finding of intra and extrahepatic biliary ductal dilatation with probable choledocholithiasis. Recommend GI consultation and further evaluation with ERCP or MRCP. -Patient declined GI consult/MRCP or ERCP -CT head report reviewed with patient -Chest x-ray reviewed with patient -Lab studies reviewed with patient -Despite prolonged conversation and attempts to convince patient to stay for overnight for further investigations and treatment patient insisted on leaving AGAINST MEDICAL ADVICE  --Please note that patient was neither seen nor physically examined by the hospitalist team -- Darlene [name] is  a  59 y.o.  who is AAO x 3 without significant depressive symptoms and without delusion/psychosis who is able to understand (without significant language barrier) her current medical diagnosis, patient also verbalizes understanding of proposed treatment options including option of no treatment, the consequences/risk versus benefit of each treatment option, alternatives as well as the option of no treatment. Based on my evaluation by phone consult [name]  appears to have the Capacity to make decisions and give informed consent about her medical care.  No surrogate decision-maker is required as Darlene [name]  appears to have capacity to make her own decisions and give informed consent regarding her medical care  - Patient declined further treatment, investigations and interventions for evaluation and examinations -Patient  left AGAINST MEDICAL ADVICE -Patient wants to go home and smoke and follow-up with PCP Dr. Margo Aye  for further work-up as outpatient -Patient states that her sister who lives next door is an Charity fundraiser and can keep an eye on her  [name], MD

## 2020-08-12 LAB — PTH, INTACT AND CALCIUM
Calcium, Total (PTH): 14.2 mg/dL (ref 8.7–10.2)
PTH: 9 pg/mL — ABNORMAL LOW (ref 15–65)

## 2020-08-12 LAB — HEPATIC FUNCTION PANEL
ALT: 18 U/L (ref 0–44)
AST: 22 U/L (ref 15–41)
Albumin: 4.3 g/dL (ref 3.5–5.0)
Alkaline Phosphatase: 73 U/L (ref 38–126)
Bilirubin, Direct: <0.1 mg/dL (ref 0.0–0.2)
Total Bilirubin: 0.7 mg/dL (ref 0.3–1.2)
Total Protein: 8.2 g/dL — ABNORMAL HIGH (ref 6.5–8.1)

## 2020-08-18 LAB — HEPATIC FUNCTION PANEL
ALT: 23 (ref 7–35)
AST: 26 (ref 13–35)
Alkaline Phosphatase: 82 (ref 25–125)

## 2020-08-18 LAB — COMPREHENSIVE METABOLIC PANEL
Albumin: 4.4 (ref 3.5–5.0)
Calcium: 9.7 (ref 8.7–10.7)
GFR calc Af Amer: 111
GFR calc non Af Amer: 96
GFR calc non Af Amer: 96

## 2020-08-18 LAB — BASIC METABOLIC PANEL
BUN: 16 (ref 4–21)
BUN: 16 (ref 4–21)
CO2: 26 — AB (ref 13–22)
Chloride: 98 — AB (ref 99–108)
Creatinine: 0.7 (ref 0.5–1.1)
Creatinine: 0.7 (ref 0.5–1.1)
Potassium: 4.9 (ref 3.4–5.3)
Sodium: 137 (ref 137–147)

## 2020-08-19 LAB — TSH: TSH: 3.78 (ref 0.41–5.90)

## 2020-09-09 ENCOUNTER — Encounter: Payer: Self-pay | Admitting: "Endocrinology

## 2020-09-09 ENCOUNTER — Ambulatory Visit (INDEPENDENT_AMBULATORY_CARE_PROVIDER_SITE_OTHER): Payer: 59 | Admitting: "Endocrinology

## 2020-09-09 ENCOUNTER — Other Ambulatory Visit: Payer: Self-pay

## 2020-09-09 ENCOUNTER — Telehealth: Payer: Self-pay

## 2020-09-09 VITALS — BP 172/98 | HR 92 | Ht 63.0 in | Wt 112.2 lb

## 2020-09-09 DIAGNOSIS — R947 Abnormal results of other endocrine function studies: Secondary | ICD-10-CM | POA: Diagnosis not present

## 2020-09-09 DIAGNOSIS — I10 Essential (primary) hypertension: Secondary | ICD-10-CM

## 2020-09-09 NOTE — Telephone Encounter (Signed)
Patient is covered effective [employer]- covered since 11/15/12- Darlene [name] DOB is [birth year]

## 2020-09-09 NOTE — Progress Notes (Signed)
Endocrinology Consult Note                                            09/09/2020, 11:27 AM   Subjective:    Patient ID: Darlene [name], female    DOB: 05/20/1961, PCP Benita Stabile, MD   Past Medical History:  Diagnosis Date  . Anemia   . Arthritis   . Asthma    Activity induced  . Essential hypertension, benign   . Family history of adverse reaction to anesthesia    Mom and daughter had difficulty waking up post surgery.  Mother's body temperature dropped during and after surgery.   . Headache   . History of vertigo   . Impaired fasting glucose   . Thyroid nodule    Past Surgical History:  Procedure Laterality Date  . ABDOMINAL HYSTERECTOMY    . CHOLECYSTECTOMY OPEN    . IR ANGIO INTRA EXTRACRAN SEL INTERNAL CAROTID BILAT MOD SED  10/16/2019  . IR ANGIO INTRA EXTRACRAN SEL INTERNAL CAROTID UNI R MOD SED  11/27/2019  . IR ANGIO INTRA EXTRACRAN SEL INTERNAL CAROTID UNI R MOD SED  05/22/2020  . IR ANGIO VERTEBRAL SEL VERTEBRAL BILAT MOD SED  10/16/2019  . IR ANGIOGRAM FOLLOW UP STUDY  11/27/2019  . IR TRANSCATH/EMBOLIZ  11/27/2019  . Left hand surgery    . PLACEMENT OF BREAST IMPLANTS    . RADIOLOGY WITH ANESTHESIA N/A 11/27/2019   Procedure: Surpass embolization of right carotid aneurysm;  Surgeon: Lisbeth Renshaw, MD;  Location: [facility] OR;  Service: Radiology;  Laterality: N/A;   Social History   Socioeconomic History  . Marital status: Married    Spouse name: Not on file  . Number of children: Not on file  . Years of education: Not on file  . Highest education level: Not on file  Occupational History  . Not on file  Tobacco Use  . Smoking status: Current Every Day Smoker    Packs/day: 1.00    Years: 35.00    Pack years: 35.00    Types: Cigarettes  . Smokeless tobacco: Never Used  Vaping Use  . Vaping Use: Never used  Substance and Sexual Activity  . Alcohol use: No  . Drug use: Yes    Types: Hydromorphone  . Sexual activity: Yes  Other Topics Concern  .  Not on file  Social History Narrative  . Not on file   Social Determinants of Health   Financial Resource Strain:   . Difficulty of Paying Living Expenses: Not on file  Food Insecurity:   . Worried About Programme researcher, broadcasting/film/video in the Last Year: Not on file  . Ran Out of Food in the Last Year: Not on file  Transportation Needs:   . Lack of Transportation (Medical): Not on file  . Lack of Transportation (Non-Medical): Not on file  Physical Activity:   . Days of Exercise per Week: Not on file  . Minutes of Exercise per Session: Not on file  Stress:   . Feeling of Stress : Not on file  Social Connections:   . Frequency of Communication with Friends and Family: Not on file  . Frequency of Social Gatherings with Friends and Family: Not on file  . Attends Religious Services: Not on file  . Active Member of Clubs or Organizations: Not on file  .  Attends BankerClub or Organization Meetings: Not on file  . Marital Status: Not on file   Family History  Problem Relation Age of Onset  . Hypertension Father   . Cancer Father   . Heart attack Father   . Stroke Father   . Cancer Mother    Outpatient Encounter Medications as of 09/09/2020  Medication Sig  . [DISCONTINUED] ALPRAZolam (XANAX) 0.25 MG tablet Take 0.25 mg by mouth at bedtime as needed for anxiety.  . [DISCONTINUED] potassium chloride (KLOR-CON) 10 MEQ tablet Take 10 mEq by mouth daily.  Marland Kitchen. acetaminophen (TYLENOL) 500 MG tablet Take 500 mg by mouth every 6 (six) hours as needed (pain.). For pain   . amLODipine (NORVASC) 2.5 MG tablet Take 5 mg by mouth every evening.   . Ascorbic Acid (VITAMIN C) 1000 MG tablet Take 5,000 mg by mouth daily.   Marland Kitchen. aspirin EC 325 MG tablet Take 325 mg by mouth daily.  . cholecalciferol (VITAMIN D) 25 MCG (1000 UT) tablet Take 2,000 Units by mouth at bedtime.  . diphenhydrAMINE (BENADRYL) 25 mg capsule Take 25 mg by mouth 2 (two) times daily as needed. For allergies   . HYDROcodone-acetaminophen (NORCO)  10-325 MG tablet Take 1 tablet by mouth every 6 (six) hours as needed for pain.  Marland Kitchen. ibuprofen (ADVIL,MOTRIN) 200 MG tablet Take 400 mg by mouth every 8 (eight) hours as needed.   Marland Kitchen. MAGNESIUM PO Take 200 mg by mouth at bedtime. 1 teaspoon (200 mg)  . [DISCONTINUED] clopidogrel (PLAVIX) 75 MG tablet Take 75 mg by mouth daily.  . [DISCONTINUED] escitalopram (LEXAPRO) 5 MG tablet Take 5 mg by mouth daily.  . [DISCONTINUED] hydrALAZINE (APRESOLINE) 25 MG tablet Take 25 mg by mouth 2 (two) times daily.  . [DISCONTINUED] lisinopril (ZESTRIL) 10 MG tablet Take 1 tablet (10 mg total) by mouth daily. (Patient not taking: Reported on 08/11/2020)   No facility-administered encounter medications on file as of 09/09/2020.   ALLERGIES: Allergies  Allergen Reactions  . Chlorthalidone     Dizzy spells and electrolyte abnormalities  . Prednisone     Stroke like symptoms   . Bee Venom Swelling and Rash    VACCINATION STATUS: Immunization History  Administered Date(s) Administered  . Tdap 09/06/2011    HPI Darlene Guttingina M Helminiak is 59 y.o. female who presents today with a medical history as above. she is being seen in consultation for endocrine lab abnormalities  requested by Benita StabileHall, John Z, MD. History was obtained directly from the patient as well as chart review.  Her medical records indicate ER visit on August 11, 2020 when she was found to have severe hypercalcemia of 13.8 milligrams per DL.  Repeat labs on the same day showed persistent hypercalcemia of 14.2 milligrams per DL with low PTH of 9. -Records show that she left hospital AGAINST MEDICAL ADVICE and no subsequent documentation of treatment for hypercalcemia.  States that she took some antiacid only 1 time on the day of ER visit.  She does not take calcium supplements on regular basis.  A week later on August 18, 2020 she went to see her PMD Dr. Margo AyeHall where her labs showed normal calcium of 9.7 mg per DL associated with normal PTH of 28.  Her thyroid  function tests were also within normal limits. -Patient denies any prior history of thyroid, parathyroid dysfunction. She denies family history of calcium dysregulation. Her major complaint today is diffuse pain including " migraine headaches".  She was found to have severe  hypertension at 172/98.  She takes amlodipine 5 mg p.o. daily.  She wishes to avoid any medication adjustment saying that she knows how to manage it at home and her blood pressure registers high only in doctor's offices.  She states that she feels worse when her blood pressure is controlled. She is on vitamin D supplements, Norco for pain management as well as magnesium supplements.  She denies paroxysmal palpitations, sweating.  She is active smoker.  She has fluctuating body weight, current BMI is 19.8   Review of Systems  Constitutional: no recent weight gain/loss, no fatigue, no subjective hyperthermia, no subjective hypothermia Eyes: no blurry vision, no xerophthalmia ENT: no sore throat, no nodules palpated in throat, no dysphagia/odynophagia, no hoarseness Cardiovascular: no Chest Pain, no Shortness of Breath, no palpitations, no leg swelling Respiratory: no cough, no shortness of breath Gastrointestinal: no Nausea/Vomiting/Diarhhea Musculoskeletal: no muscle/joint aches Skin: no rashes Neurological: no tremors, no numbness, no tingling, no dizziness Psychiatric: no depression, no anxiety  Objective:    Vitals with BMI 09/09/2020 08/11/2020 08/11/2020  Height 5\' 3"  - -  Weight 112 lbs 3 oz - -  BMI 19.88 - -  Systolic 172 134 -  Diastolic 98 72 -  Pulse 92 74 75    BP (!) 172/98   Pulse 92   Ht 5\' 3"  (1.6 m)   Wt 112 lb 3.2 oz (50.9 kg)   BMI 19.88 kg/m   Wt Readings from Last 3 Encounters:  09/09/20 112 lb 3.2 oz (50.9 kg)  08/11/20 105 lb (47.6 kg)  05/22/20 110 lb (49.9 kg)    Physical Exam  Constitutional:  Body mass index is 19.88 kg/m.,  not in acute distress, normal state of mind Eyes:  PERRLA, EOMI, no exophthalmos ENT: moist mucous membranes, no gross thyromegaly, no gross cervical lymphadenopathy Cardiovascular: normal precordial activity, Regular Rate and Rhythm, no Murmur/Rubs/Gallops Respiratory:  adequate breathing efforts, no gross chest deformity, Clear to auscultation bilaterally  Musculoskeletal: no gross deformities, strength intact in all four extremities Skin: moist, warm, no rashes Neurological: no tremor with outstretched hands, Deep tendon reflexes normal in bilateral lower extremities.  CMP ( most recent) CMP     Component Value Date/Time   NA 137 08/18/2020 0000   K 4.9 08/18/2020 0000   CL 98 (A) 08/18/2020 0000   CO2 26 (A) 08/18/2020 0000   GLUCOSE 116 (H) 08/11/2020 1300   BUN 16 08/18/2020 0000   BUN 16 08/18/2020 0000   CREATININE 0.7 08/18/2020 0000   CREATININE 0.7 08/18/2020 0000   CREATININE 0.92 08/11/2020 1300   CREATININE 0.63 05/20/2013 1241   CALCIUM 9.7 08/18/2020 0000   CALCIUM 14.2 (HH) 08/11/2020 1427   PROT 8.2 (H) 08/11/2020 1251   ALBUMIN 4.4 08/18/2020 0000   AST 26 08/18/2020 0000   ALT 23 08/18/2020 0000   ALKPHOS 82 08/18/2020 0000   BILITOT 0.7 08/11/2020 1251   GFRNONAA 96 08/18/2020 0000   GFRNONAA 96 08/18/2020 0000   GFRAA 111 08/18/2020 0000     Diabetic Labs (most recent): Lab Results  Component Value Date   HGBA1C 5.5 09/14/2019     Lipid Panel ( most recent) Lipid Panel     Component Value Date/Time   CHOL 200 09/14/2019 0703   TRIG 93 09/14/2019 0703   HDL 65 09/14/2019 0703   CHOLHDL 3.1 09/14/2019 0703   VLDL 19 09/14/2019 0703   LDLCALC 116 (H) 09/14/2019 0703      Lab Results  Component  Value Date   TSH 3.78 08/18/2020   TSH 1.904 08/11/2020   TSH 2.143 05/20/2013           Assessment & Plan:   1. Endocrine function study abnormality  - OLIVIAGRACE CRISANTI  is being seen at a kind request of Margo Aye, Kathleene Hazel, MD. - I have reviewed her available endocrine records and clinically  evaluated the patient. - Based on these reviews, she has had transient hypercalcemia which seems to have resolved without intervention.  Her most recent labs from August 18 2020 showing normal calcium and normal PTH as well as normal thyroid function. She does not have a diagnosis of hypercalcemia nor thyrotoxicosis at this time. -To clear any confusion, she will need repeat labs in about 10 days including:  - Comprehensive metabolic panel - Magnesium - Phosphorus - PTH, intact and calcium - TSH - T4, free - T3, free - Vitamin D pnl(25-hydrxy+1,25-dihy)-bld  Her major issue today seems to be high blood pressure, which will likely require more than 1 medication to manage, however patient declined an offer from me to treat it with adjusting her medications and adding more medications potentially.  She seems to like amlodipine which she could easily increase her amlodipine to 10 mg p.o. daily, however she explains that she monitors her blood pressure at home and reads better there and would like to follow-up with her PMD for that.  - I did not initiate any new prescriptions today. - she is advised to maintain close follow up with Benita Stabile, MD for primary care needs.   - Time spent with the patient: 45 minutes, of which >50% was spent in  counseling her about her history of hypercalcemia and the rest in obtaining information about her symptoms, reviewing her previous labs/studies ( including abstractions from other facilities),  evaluations, and treatments,  and developing a plan to confirm diagnosis and long term treatment based on the latest standards of care/guidelines; and documenting her care.  Darlene [name] participated in the discussions, expressed understanding, and voiced agreement with the above plans.  All questions were answered to her satisfaction. she is encouraged to contact clinic should she have any questions or concerns prior to her return visit.  Follow up plan: Return in  about 2 weeks (around 09/23/2020) for F/U with Pre-visit Labs.   Marquis Lunch, MD [facility] [address] [place], [place] [address] Phone: [phone]  Fax: [phone]     09/09/2020, 11:27 AM  This note was partially dictated with voice recognition software. Similar sounding words can be transcribed inadequately or may not  be corrected upon review.

## 2020-09-24 ENCOUNTER — Ambulatory Visit: Payer: 59 | Admitting: "Endocrinology

## 2021-05-06 ENCOUNTER — Encounter (HOSPITAL_COMMUNITY): Payer: Self-pay

## 2021-05-06 ENCOUNTER — Ambulatory Visit (HOSPITAL_COMMUNITY): Payer: 59 | Attending: Orthopaedic Surgery

## 2021-05-06 ENCOUNTER — Other Ambulatory Visit: Payer: Self-pay

## 2021-05-06 DIAGNOSIS — M545 Low back pain, unspecified: Secondary | ICD-10-CM

## 2021-05-06 DIAGNOSIS — G8929 Other chronic pain: Secondary | ICD-10-CM | POA: Insufficient documentation

## 2021-05-06 DIAGNOSIS — M6281 Muscle weakness (generalized): Secondary | ICD-10-CM | POA: Diagnosis present

## 2021-05-06 NOTE — Therapy (Signed)
[facility] [address] [place], [place], [address] Phone: [phone]57   Fax:  [phone]  Physical Therapy Evaluation  Patient Details  Name: Rosanne Guttingina M Vineyard MRN: [id number] Date of Birth: 24-Aug-1961 Referring Provider (PT): Dr. Sharolyn DouglasMax Cohen   Encounter Date: 05/06/2021   PT End of Session - 05/06/21 0902     Visit Number 1    Number of Visits 12    Date for PT Re-Evaluation 06/17/21    Authorization Type Generic [employer], "visits in excess of 30 visits will be reviewed for continued medical necessity"    PT Start Time 0900    PT Stop Time 0945    PT Time Calculation (min) 45 min             Past Medical History:  Diagnosis Date   Anemia    Arthritis    Asthma    Activity induced   Essential hypertension, benign    Family history of adverse reaction to anesthesia    Mom and daughter had difficulty waking up post surgery.  Mother's body temperature dropped during and after surgery.    Headache    History of vertigo    Impaired fasting glucose    Thyroid nodule     Past Surgical History:  Procedure Laterality Date   ABDOMINAL HYSTERECTOMY     CHOLECYSTECTOMY OPEN     IR ANGIO INTRA EXTRACRAN SEL INTERNAL CAROTID BILAT MOD SED  10/16/2019   IR ANGIO INTRA EXTRACRAN SEL INTERNAL CAROTID UNI R MOD SED  11/27/2019   IR ANGIO INTRA EXTRACRAN SEL INTERNAL CAROTID UNI R MOD SED  05/22/2020   IR ANGIO VERTEBRAL SEL VERTEBRAL BILAT MOD SED  10/16/2019   IR ANGIOGRAM FOLLOW UP STUDY  11/27/2019   IR TRANSCATH/EMBOLIZ  11/27/2019   Left hand surgery     PLACEMENT OF BREAST IMPLANTS     RADIOLOGY WITH ANESTHESIA N/A 11/27/2019   Procedure: Surpass embolization of right carotid aneurysm;  Surgeon: Lisbeth RenshawNundkumar, Neelesh, MD;  [facility];  Service: Radiology;  Laterality: N/A;    There were no vitals filed for this visit.    Subjective Assessment - 05/06/21 0906     Subjective Patient reports pain all over but being seen for LBP.  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 Most recent injury was in the 90's. Pt reports constant LBP and has mainly been using medication for symptom relief. Pt notes "nerve pain that extends down the left leg and stops at the knee and wraps back up to the groin."    Limitations Sitting;Lifting;Standing;House hold activities    How long can you sit comfortably? 10 minutes    How long can you stand comfortably? 10 minutes    How long can you walk comfortably? "I can walk more than anything."    Diagnostic tests x-ray and MRI: reveals OA, bone spurs, DDD    Currently in Pain? Yes    Pain Score 3     Pain Location Back    Pain Orientation Lower;Mid    Pain Descriptors / Indicators Aching;Sore;Radiating;Sharp;Numbness;Dull    Pain Type Chronic pain    Pain Radiating Towards LLE buttocks, knee, and wraps to left groin    Pain Onset More than a month ago    Pain Frequency Constant  Aggravating Factors  sitting, bending backwards, carrying > 5 lbs    Pain Relieving Factors laying flat on back on wooden floor, wearing SI lock belt                [facility] PT Assessment - 05/06/21 0001       Assessment   Medical Diagnosis M47.896 Spondylosis    Referring Provider (PT) Dr. Sharolyn Douglas    Prior Therapy none for her back      Restrictions   Weight Bearing Restrictions No      Balance Screen   Has the patient fallen in the past 6 months No    Has the patient had a decrease in activity level because of a fear of falling?  No    Is the patient reluctant to leave their home because of a fear of falling?  No      Prior Function   Level of Independence Independent    Leisure gardening, yard work, family time.      Observation/Other Assessments   Focus on Therapeutic Outcomes (FOTO)  39% function      ROM /  Strength   AROM / PROM / Strength AROM;Strength      AROM   AROM Assessment Site Lumbar    Lumbar Flexion WNL    Lumbar Extension 10% limited    Lumbar - Right Side Bend WNL    Lumbar - Left Side Bend WNL    Lumbar - Right Rotation WNL    Lumbar - Left Rotation WNL      Strength   Strength Assessment Site Lumbar    Lumbar Flexion 2+/5    Lumbar Extension 3/5      Palpation   Spinal mobility decreased extension vis PIVM PA    SI assessment  unrevealing      Special Tests    Special Tests Lumbar    Lumbar Tests FABER test;Straight Leg Raise      FABER test   findings Positive    Side Right    Comment Lt      Straight Leg Raise   Findings Negative    Side  Right    Comment Lt      Transfers   Transfers Sit to Stand;Stand Pivot Transfers    Sit to Stand 7: Independent      Ambulation/Gait   Ambulation/Gait Yes    Ambulation/Gait Assistance 7: Independent    Ambulation Distance (Feet) 350 Feet    Assistive device None    Gait Pattern Within Functional Limits    Ambulation Surface Level;Indoor                        Objective measurements completed on examination: See above findings.       [facility] Adult PT Treatment/Exercise - 05/06/21 0001       Exercises   Exercises Lumbar      Lumbar Exercises: Stretches   Single Knee to Chest Stretch Right;Left;1 rep;30 seconds    Double Knee to Chest Stretch 1 rep;30 seconds      Lumbar Exercises: Supine   Ab Set 10 reps;3 seconds                    PT Education - 05/06/21 1052     Education Details discussion and education on anatomy, spondylosis, and rationale of core strength to improve stability and function    Person(s) Educated Patient    Methods Explanation  Comprehension Verbalized understanding              PT Short Term Goals - 05/06/21 1207       PT SHORT TERM GOAL #1   Title Patient will be independent with HEP in order to improve functional outcomes.    Time 3     Period Weeks    Status New    Target Date 05/27/21      PT SHORT TERM GOAL #2   Title Patient will report at least 25% improvement in symptoms for improved quality of life.    Time 3    Period Weeks    Status New    Target Date 05/27/21      PT SHORT TERM GOAL #3   Title Demo proper squat lift form to improve body mechanics to reduce pain during gardeneing activities    Baseline poor squat form    Time 3    Period Weeks    Status New    Target Date 05/27/21               PT Long Term Goals - 05/06/21 1208       PT LONG TERM GOAL #1   Title Demo trunk strength of 4/5 to improve lumbar stability when lifting    Baseline 3/5 extension, 2+/5 flexion    Time 6    Period Weeks    Status New    Target Date 06/17/21      PT LONG TERM GOAL #2   Title Patient will improve FOTO score by at least 5 points in order to indicate improved tolerance to activity.    Baseline 39% function    Time 6    Period Weeks    Status New    Target Date 06/17/21                    Plan - 05/06/21 1137     Clinical Impression Statement Patient is a 60 yo lady presenting to physical therapy with c/o chronic LBP. She presents with pain limited deficits in lumbar strength, ROM, endurance, postural impairments, spinal mobility and functional mobility with ADL. She is having to modify and restrict ADL as indicated by FOTO score as well as subjective information and objective measures which is affecting overall participation. Patient will benefit from skilled physical therapy in order to improve function and reduce impairment.    Personal Factors and Comorbidities Age;Time since onset of injury/illness/exacerbation;Past/Current Experience    Examination-Activity Limitations Carry;Lift;Stand;Stairs;Squat;Locomotion Level    Examination-Participation Restrictions Cleaning;Community Activity;Yard Work;Occupation    Stability/Clinical Decision Making Stable/Uncomplicated    Clinical  Decision Making Low    Rehab Potential Good    PT Frequency 2x / week    PT Duration 6 weeks    PT Treatment/Interventions ADLs/Self Care Home Management;Aquatic Therapy;Electrical Stimulation;DME Instruction;Traction;Moist Heat;Gait training;Stair training;Functional mobility training;Therapeutic activities;Therapeutic exercise;Balance training;Patient/family education;Neuromuscular re-education;Manual techniques;Spinal Manipulations;Joint Manipulations    PT Next Visit Plan Continue with core strengthening, body mechanics    PT Home Exercise Plan SKTC, DKTC, PPT    Consulted and Agree with Plan of Care Patient             Patient will benefit from skilled therapeutic intervention in order to improve the following deficits and impairments:  Decreased activity tolerance, Decreased strength, Decreased range of motion, Improper body mechanics, Pain  Visit Diagnosis: Chronic low back pain, unspecified back pain laterality, unspecified whether sciatica present  Muscle weakness (generalized)  Problem List Patient Active Problem List   Diagnosis Date Noted   Endocrine function study abnormality 09/09/2020   Hypercalcemia 08/11/2020   Cerebral aneurysm, nonruptured 11/27/2019   Hypertensive urgency 09/14/2019   Hypokalemia    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 Shary Decamp, PT, DPT Physical Therapist- [place] Office Number: [phone]   [facility] [address] [place], [place], [address] Phone: [phone]   Fax:  [phone]  Name: STEPHENI CAMERON MRN: [id number] Date of Birth: 01/05/1961

## 2021-05-06 NOTE — Patient Instructions (Signed)
Access Code: [email or web address] URL: [email or web address] Date: 05/06/2021 Prepared by: Shary Decamp  Exercises Supine Single Knee to Chest Stretch - 1 x daily - 7 x weekly - 3 sets - 5 reps - 30 sec hold Supine Double Knee to Chest - 1 x daily - 7 x weekly - 3 sets - 5 reps - 30-60 sec hold Supine Posterior Pelvic Tilt - 1 x daily - 7 x weekly - 3 sets - 10 reps - 3 sec hold

## 2021-05-07 ENCOUNTER — Ambulatory Visit (HOSPITAL_COMMUNITY): Payer: 59 | Admitting: Physical Therapy

## 2021-05-07 ENCOUNTER — Other Ambulatory Visit: Payer: Self-pay

## 2021-05-07 DIAGNOSIS — M545 Low back pain, unspecified: Secondary | ICD-10-CM | POA: Diagnosis not present

## 2021-05-07 DIAGNOSIS — G8929 Other chronic pain: Secondary | ICD-10-CM

## 2021-05-07 DIAGNOSIS — M6281 Muscle weakness (generalized): Secondary | ICD-10-CM

## 2021-05-07 NOTE — Therapy (Signed)
[place], [place], [address] Phone: [phone]   Fax:  [phone]  Physical Therapy Treatment  Patient Details  Name: Darlene [name] MRN: [id number] Date of Birth: Aug 27, 1961 Referring Provider (PT): Dr. Rennis Harding   Encounter Date: 05/07/2021   PT End of Session - 05/07/21 1311     Visit Number 2    Number of Visits 12    Date for PT Re-Evaluation 06/17/21    Authorization Type Generic [employer], "visits in excess of 30 visits will be reviewed for continued medical necessity"    PT Start Time 1305    PT Stop Time 1345    PT Time Calculation (min) 40 min             Past Medical History:  Diagnosis Date   Anemia    Arthritis    Asthma    Activity induced   Essential hypertension, benign    Family history of adverse reaction to anesthesia    Mom and daughter had difficulty waking up post surgery.  Mother's body temperature dropped during and after surgery.    Headache    History of vertigo    Impaired fasting glucose    Thyroid nodule     Past Surgical History:  Procedure Laterality Date   ABDOMINAL HYSTERECTOMY     CHOLECYSTECTOMY OPEN     IR ANGIO INTRA EXTRACRAN SEL INTERNAL CAROTID BILAT MOD SED  10/16/2019   IR ANGIO INTRA EXTRACRAN SEL INTERNAL CAROTID UNI R MOD SED  11/27/2019   IR ANGIO INTRA EXTRACRAN SEL INTERNAL CAROTID UNI R MOD SED  05/22/2020   IR ANGIO VERTEBRAL SEL VERTEBRAL BILAT MOD SED  10/16/2019   IR ANGIOGRAM FOLLOW UP STUDY  11/27/2019   IR TRANSCATH/EMBOLIZ  11/27/2019   Left hand surgery     PLACEMENT OF BREAST IMPLANTS     RADIOLOGY WITH ANESTHESIA N/A 11/27/2019   Procedure: Surpass embolization of right carotid aneurysm;  Surgeon: Consuella Lose, MD;  Location: [place];  Service: Radiology;  Laterality: N/A;    There were no vitals filed for this visit.   Subjective Assessment - 05/07/21 1309     Subjective Patient says she feels about the same as yesterday. Has been  compliant with HEP. DKTC feels good, SKTC hurts a little.    Limitations Sitting;Lifting;Standing;House hold activities    How long can you sit comfortably? 10 minutes    How long can you stand comfortably? 10 minutes    How long can you walk comfortably? "I can walk more than anything."    Diagnostic tests x-ray and MRI: reveals OA, bone spurs, DDD    Currently in Pain? Yes    Pain Score 5     Pain Location Back    Pain Orientation Lower;Posterior    Pain Descriptors / Indicators Aching;Sharp;Radiating;Sore;Numbness    Pain Type Chronic pain    Pain Onset More than a month ago    Pain Frequency Constant                               [facility] Adult PT Treatment/Exercise - 05/07/21 0001       Lumbar Exercises: Stretches   Single Knee to Chest Stretch Right;Left;5 reps;10 seconds    Double Knee to Chest Stretch 5 reps;10 seconds      Lumbar Exercises: Supine   Ab Set 10 reps;5 seconds    Bridge  15 reps    Straight Leg Raise 15 reps    Other Supine Lumbar Exercises isometric hip abd/ add 15 x 5"                      PT Short Term Goals - 05/06/21 1207       PT SHORT TERM GOAL #1   Title Patient will be independent with HEP in order to improve functional outcomes.    Time 3    Period Weeks    Status New    Target Date 05/27/21      PT SHORT TERM GOAL #2   Title Patient will report at least 25% improvement in symptoms for improved quality of life.    Time 3    Period Weeks    Status New    Target Date 05/27/21      PT SHORT TERM GOAL #3   Title Demo proper squat lift form to improve body mechanics to reduce pain during gardeneing activities    Baseline poor squat form    Time 3    Period Weeks    Status New    Target Date 05/27/21               PT Long Term Goals - 05/06/21 1208       PT LONG TERM GOAL #1   Title Demo trunk strength of 4/5 to improve lumbar stability when lifting    Baseline 3/5 extension, 2+/5 flexion     Time 6    Period Weeks    Status New    Target Date 06/17/21      PT LONG TERM GOAL #2   Title Patient will improve FOTO score by at least 5 points in order to indicate improved tolerance to activity.    Baseline 39% function    Time 6    Period Weeks    Status New    Target Date 06/17/21                   Plan - 05/07/21 1345     Clinical Impression Statement Reviewed goals and initiated ther ex. Patient with some weakness and discomfort noted about LT hip with strengthening activity. Educated patient on and performed MET for SI corrections. Patient noted some improvement in resting/ standing pain and was able to ambulate 226 feet in clinic before onset of pain. Also notes some improvement in standing lumbar extension following. Updated and issued HEP handout. Patient will continue to benefit from skilled therapy services to reduce deficits and improve functional ability.    Personal Factors and Comorbidities Age;Time since onset of injury/illness/exacerbation;Past/Current Experience    Examination-Activity Limitations Carry;Lift;Stand;Stairs;Squat;Locomotion Level    Examination-Participation Restrictions Cleaning;Community Activity;Yard Work;Occupation    Stability/Clinical Decision Making Stable/Uncomplicated    Rehab Potential Good    PT Frequency 2x / week    PT Duration 6 weeks    PT Treatment/Interventions ADLs/Self Care Home Management;Aquatic Therapy;Electrical Stimulation;DME Instruction;Traction;Moist Heat;Gait training;Stair training;Functional mobility training;Therapeutic activities;Therapeutic exercise;Balance training;Patient/family education;Neuromuscular re-education;Manual techniques;Spinal Manipulations;Joint Manipulations    PT Next Visit Plan Continue with core strengthening, body mechanics    PT Home Exercise Plan SKTC, DKTC, PPT 6/23 hip abd/ add isometric, bridge    Consulted and Agree with Plan of Care Patient             Patient will benefit  from skilled therapeutic intervention in order to improve the following deficits and impairments:  Decreased activity tolerance, Decreased  strength, Decreased range of motion, Improper body mechanics, Pain  Visit Diagnosis: Chronic low back pain, unspecified back pain laterality, unspecified whether sciatica present  Muscle weakness (generalized)     Problem List Patient Active Problem List   Diagnosis Date Noted   Endocrine function study abnormality 09/09/2020   Hypercalcemia 08/11/2020   Cerebral aneurysm, nonruptured 11/27/2019   Hypertensive urgency 09/14/2019   Hypokalemia    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

## 2021-05-07 NOTE — Patient Instructions (Signed)
Access Code: [email or web address] URL: [email or web address] Date: 05/07/2021 Prepared by: Georges Lynch  Exercises Hooklying Isometric Hip Abduction with Belt - 3 x daily - 7 x weekly - 1-2 sets - 10 reps - 5 second hold Supine Hip Adduction Isometric with Ball - 3 x daily - 7 x weekly - 1-2 sets - 10 reps - 5 second hold Supine Bridge - 3 x daily - 7 x weekly - 1-2 sets - 10 reps - 5 second hold

## 2021-05-12 ENCOUNTER — Ambulatory Visit (HOSPITAL_COMMUNITY): Payer: 59

## 2021-05-12 ENCOUNTER — Other Ambulatory Visit: Payer: Self-pay

## 2021-05-12 ENCOUNTER — Encounter (HOSPITAL_COMMUNITY): Payer: Self-pay

## 2021-05-12 DIAGNOSIS — G8929 Other chronic pain: Secondary | ICD-10-CM

## 2021-05-12 DIAGNOSIS — M545 Low back pain, unspecified: Secondary | ICD-10-CM | POA: Diagnosis not present

## 2021-05-12 DIAGNOSIS — M6281 Muscle weakness (generalized): Secondary | ICD-10-CM

## 2021-05-12 NOTE — Therapy (Signed)
[place] [facility] [address] [place], [place], [address] Phone: [phone]   Fax:  [phone]  Physical Therapy Treatment  Patient Details  Name: Darlene [name] MRN: [id number] Date of Birth: 13-Mar-1961 Referring Provider (PT): Dr. Sharolyn Douglas   Encounter Date: 05/12/2021   PT End of Session - 05/12/21 1604     Visit Number 3    Number of Visits 12    Date for PT Re-Evaluation 06/17/21    Authorization Type Generic [employer], "visits in excess of 30 visits will be reviewed for continued medical necessity"    PT Start Time 1600    PT Stop Time 1645    PT Time Calculation (min) 45 min    Activity Tolerance Patient tolerated treatment well    Behavior During Therapy [facility] for tasks assessed/performed             Past Medical History:  Diagnosis Date   Anemia    Arthritis    Asthma    Activity induced   Essential hypertension, benign    Family history of adverse reaction to anesthesia    Mom and daughter had difficulty waking up post surgery.  Mother's body temperature dropped during and after surgery.    Headache    History of vertigo    Impaired fasting glucose    Thyroid nodule     Past Surgical History:  Procedure Laterality Date   ABDOMINAL HYSTERECTOMY     CHOLECYSTECTOMY OPEN     IR ANGIO INTRA EXTRACRAN SEL INTERNAL CAROTID BILAT MOD SED  10/16/2019   IR ANGIO INTRA EXTRACRAN SEL INTERNAL CAROTID UNI R MOD SED  11/27/2019   IR ANGIO INTRA EXTRACRAN SEL INTERNAL CAROTID UNI R MOD SED  05/22/2020   IR ANGIO VERTEBRAL SEL VERTEBRAL BILAT MOD SED  10/16/2019   IR ANGIOGRAM FOLLOW UP STUDY  11/27/2019   IR TRANSCATH/EMBOLIZ  11/27/2019   Left hand surgery     PLACEMENT OF BREAST IMPLANTS     RADIOLOGY WITH ANESTHESIA N/A 11/27/2019   Procedure: Surpass embolization of right carotid aneurysm;  Surgeon: Lisbeth Renshaw, MD;  Location: [facility] OR;  Service: Radiology;  Laterality: N/A;    There were no vitals filed for this visit.    Subjective Assessment - 05/12/21 1602     Subjective "My back has been in a spasm today from working in the yard and then I had to drive alot today"    Limitations Sitting;Lifting;Standing;House hold activities    How long can you sit comfortably? 10 minutes    How long can you stand comfortably? 10 minutes    How long can you walk comfortably? "I can walk more than anything."    Diagnostic tests x-ray and MRI: reveals OA, bone spurs, DDD    Currently in Pain? Yes    Pain Score 5     Pain Location Back    Pain Orientation Lower    Pain Descriptors / Indicators Tightness    Pain Type Chronic pain    Pain Onset More than a month ago                [facility] PT Assessment - 05/12/21 0001       Assessment   Medical Diagnosis M47.896 Spondylosis    Referring Provider (PT) Dr. Sharolyn Douglas                           [facility] Adult PT Treatment/Exercise -  05/12/21 0001       Lumbar Exercises: Stretches   Prone on Elbows Stretch --   prone position x 5 min with MHP     Lumbar Exercises: Seated   Other Seated Lumbar Exercises self-traction with arm chair      Manual Therapy   Manual Therapy Joint mobilization;Manual Traction    Manual therapy comments completed separate from all other interventions    Joint Mobilization grade 2-4 PA mob to lumbar spine to reduce pain    Manual Traction manual lumbar traction to decrease pain                    PT Education - 05/12/21 1752     Education Details education on self-traction for lumbar spine    Person(s) Educated Patient    Methods Explanation    Comprehension Verbalized understanding              PT Short Term Goals - 05/06/21 1207       PT SHORT TERM GOAL #1   Title Patient will be independent with HEP in order to improve functional outcomes.    Time 3    Period Weeks    Status New    Target Date 05/27/21      PT SHORT TERM GOAL #2   Title Patient will report at least 25% improvement in  symptoms for improved quality of life.    Time 3    Period Weeks    Status New    Target Date 05/27/21      PT SHORT TERM GOAL #3   Title Demo proper squat lift form to improve body mechanics to reduce pain during gardeneing activities    Baseline poor squat form    Time 3    Period Weeks    Status New    Target Date 05/27/21               PT Long Term Goals - 05/06/21 1208       PT LONG TERM GOAL #1   Title Demo trunk strength of 4/5 to improve lumbar stability when lifting    Baseline 3/5 extension, 2+/5 flexion    Time 6    Period Weeks    Status New    Target Date 06/17/21      PT LONG TERM GOAL #2   Title Patient will improve FOTO score by at least 5 points in order to indicate improved tolerance to activity.    Baseline 39% function    Time 6    Period Weeks    Status New    Target Date 06/17/21                   Plan - 05/12/21 1753     Clinical Impression Statement Pt tolerated tx session well and reports decrease in pain and low back tightness following manual treatment.  Pt reports she has been performing exercises daily.  Continued POC indicated to progress core/trunk strength to improve functional activity tolerance    Personal Factors and Comorbidities Age;Time since onset of injury/illness/exacerbation;Past/Current Experience    Examination-Activity Limitations Carry;Lift;Stand;Stairs;Squat;Locomotion Level    Examination-Participation Restrictions Cleaning;Community Activity;Yard Work;Occupation    Stability/Clinical Decision Making Stable/Uncomplicated    Rehab Potential Good    PT Frequency 2x / week    PT Duration 6 weeks    PT Treatment/Interventions ADLs/Self Care Home Management;Aquatic Therapy;Electrical Stimulation;DME Instruction;Traction;Moist Heat;Gait training;Stair training;Functional mobility training;Therapeutic activities;Therapeutic exercise;Balance training;Patient/family education;Neuromuscular re-education;Manual  techniques;Spinal Manipulations;Joint Manipulations    PT Next Visit Plan Continue with core strengthening, body mechanics    PT Home Exercise Plan SKTC, DKTC, PPT 6/23 hip abd/ add isometric, bridge    Consulted and Agree with Plan of Care Patient             Patient will benefit from skilled therapeutic intervention in order to improve the following deficits and impairments:  Decreased activity tolerance, Decreased strength, Decreased range of motion, Improper body mechanics, Pain  Visit Diagnosis: Chronic low back pain, unspecified back pain laterality, unspecified whether sciatica present  Muscle weakness (generalized)     Problem List Patient Active Problem List   Diagnosis Date Noted   Endocrine function study abnormality 09/09/2020   Hypercalcemia 08/11/2020   Cerebral aneurysm, nonruptured 11/27/2019   Hypertensive urgency 09/14/2019   Hypokalemia    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 Shary Decamp, PT, DPT Physical Therapist- [place] Office Number: [phone]   [facility] [address] [place], [place], [address] Phone: [phone]   Fax:  [phone]  Name: Darlene [name] MRN: [id number] Date of Birth: 30-Dec-1960

## 2021-05-15 ENCOUNTER — Ambulatory Visit (HOSPITAL_COMMUNITY): Payer: 59 | Attending: Orthopaedic Surgery | Admitting: Physical Therapy

## 2021-05-15 ENCOUNTER — Other Ambulatory Visit: Payer: Self-pay

## 2021-05-15 ENCOUNTER — Encounter (HOSPITAL_COMMUNITY): Payer: Self-pay | Admitting: Physical Therapy

## 2021-05-15 DIAGNOSIS — M545 Low back pain, unspecified: Secondary | ICD-10-CM | POA: Insufficient documentation

## 2021-05-15 DIAGNOSIS — M6281 Muscle weakness (generalized): Secondary | ICD-10-CM | POA: Insufficient documentation

## 2021-05-15 DIAGNOSIS — G8929 Other chronic pain: Secondary | ICD-10-CM | POA: Insufficient documentation

## 2021-05-15 NOTE — Therapy (Signed)
[place] [facility] [address] [place], [place], [address] Phone: [phone]   Fax:  [phone]  Physical Therapy Treatment  Patient Details  Name: Darlene [name] MRN: [id number] Date of Birth: November 14, 1961 Referring Provider (PT): Dr. Sharolyn Douglas   Encounter Date: 05/15/2021   PT End of Session - 05/15/21 0759     Visit Number 4    Number of Visits 12    Date for PT Re-Evaluation 06/17/21    Authorization Type Generic [employer], "visits in excess of 30 visits will be reviewed for continued medical necessity"    PT Start Time 0800   late arrival   PT Stop Time 0825    PT Time Calculation (min) 25 min    Activity Tolerance Patient tolerated treatment well    Behavior During Therapy [facility] for tasks assessed/performed             Past Medical History:  Diagnosis Date   Anemia    Arthritis    Asthma    Activity induced   Essential hypertension, benign    Family history of adverse reaction to anesthesia    Mom and daughter had difficulty waking up post surgery.  Mother's body temperature dropped during and after surgery.    Headache    History of vertigo    Impaired fasting glucose    Thyroid nodule     Past Surgical History:  Procedure Laterality Date   ABDOMINAL HYSTERECTOMY     CHOLECYSTECTOMY OPEN     IR ANGIO INTRA EXTRACRAN SEL INTERNAL CAROTID BILAT MOD SED  10/16/2019   IR ANGIO INTRA EXTRACRAN SEL INTERNAL CAROTID UNI R MOD SED  11/27/2019   IR ANGIO INTRA EXTRACRAN SEL INTERNAL CAROTID UNI R MOD SED  05/22/2020   IR ANGIO VERTEBRAL SEL VERTEBRAL BILAT MOD SED  10/16/2019   IR ANGIOGRAM FOLLOW UP STUDY  11/27/2019   IR TRANSCATH/EMBOLIZ  11/27/2019   Left hand surgery     PLACEMENT OF BREAST IMPLANTS     RADIOLOGY WITH ANESTHESIA N/A 11/27/2019   Procedure: Surpass embolization of right carotid aneurysm;  Surgeon: Lisbeth Renshaw, MD;  Location: [facility] OR;  Service: Radiology;  Laterality: N/A;    There were no vitals filed for this  visit.   Subjective Assessment - 05/15/21 0804     Subjective States she has been having muscle spasms since two days prior last session but felt better after last session. Current pain level is 3/10 in low back pain and it is sharp and shooting but no current groin pain.    Limitations Sitting;Lifting;Standing;House hold activities    How long can you sit comfortably? 10 minutes    How long can you stand comfortably? 10 minutes    How long can you walk comfortably? "I can walk more than anything."    Diagnostic tests x-ray and MRI: reveals OA, bone spurs, DDD    Currently in Pain? Yes    Pain Score 3     Pain Location Back    Pain Orientation Lower    Pain Descriptors / Indicators Aching;Tightness    Pain Onset More than a month ago                [facility] PT Assessment - 05/15/21 0001       Assessment   Medical Diagnosis M47.896 Spondylosis    Referring Provider (PT) Dr. Sharolyn Douglas  [facility] Adult PT Treatment/Exercise - 05/15/21 0001       Lumbar Exercises: Stretches   Double Knee to Chest Stretch 10 seconds   x10   Lower Trunk Rotation --   x15 bilaterally, 5 second holds     Lumbar Exercises: Seated   Other Seated Lumbar Exercises seated self mobilization 6 minutes to both legs      Lumbar Exercises: Supine   Other Supine Lumbar Exercises hamstrin isometric on ball 2x10 5" holds      Lumbar Exercises: Quadruped   Other Quadruped Lumbar Exercises TKE 4x5, 5" holds Bilateral                      PT Short Term Goals - 05/06/21 1207       PT SHORT TERM GOAL #1   Title Patient will be independent with HEP in order to improve functional outcomes.    Time 3    Period Weeks    Status New    Target Date 05/27/21      PT SHORT TERM GOAL #2   Title Patient will report at least 25% improvement in symptoms for improved quality of life.    Time 3    Period Weeks    Status New    Target Date 05/27/21      PT SHORT  TERM GOAL #3   Title Demo proper squat lift form to improve body mechanics to reduce pain during gardeneing activities    Baseline poor squat form    Time 3    Period Weeks    Status New    Target Date 05/27/21               PT Long Term Goals - 05/06/21 1208       PT LONG TERM GOAL #1   Title Demo trunk strength of 4/5 to improve lumbar stability when lifting    Baseline 3/5 extension, 2+/5 flexion    Time 6    Period Weeks    Status New    Target Date 06/17/21      PT LONG TERM GOAL #2   Title Patient will improve FOTO score by at least 5 points in order to indicate improved tolerance to activity.    Baseline 39% function    Time 6    Period Weeks    Status New    Target Date 06/17/21                   Plan - 05/15/21 0804     Clinical Impression Statement Increased cramping noted today. Added lumbar ROM exercises which was tolerated well. Prone TKE resolved knee cramps. At end of session no pain or cramps noted. Added new exercises to HEP.    Personal Factors and Comorbidities Age;Time since onset of injury/illness/exacerbation;Past/Current Experience    Examination-Activity Limitations Carry;Lift;Stand;Stairs;Squat;Locomotion Level    Examination-Participation Restrictions Cleaning;Community Activity;Yard Work;Occupation    Stability/Clinical Decision Making Stable/Uncomplicated    Rehab Potential Good    PT Frequency 2x / week    PT Duration 6 weeks    PT Treatment/Interventions ADLs/Self Care Home Management;Aquatic Therapy;Electrical Stimulation;DME Instruction;Traction;Moist Heat;Gait training;Stair training;Functional mobility training;Therapeutic activities;Therapeutic exercise;Balance training;Patient/family education;Neuromuscular re-education;Manual techniques;Spinal Manipulations;Joint Manipulations    PT Next Visit Plan Continue with core strengthening, body mechanics    PT Home Exercise Plan SKTC, DKTC, PPT 6/23 hip abd/ add isometric, bridge;  prone TKE, DKC ball, LTR ball    Consulted and Agree with Plan  of Care Patient             Patient will benefit from skilled therapeutic intervention in order to improve the following deficits and impairments:  Decreased activity tolerance, Decreased strength, Decreased range of motion, Improper body mechanics, Pain  Visit Diagnosis: Chronic low back pain, unspecified back pain laterality, unspecified whether sciatica present     Problem List Patient Active Problem List   Diagnosis Date Noted   Endocrine function study abnormality 09/09/2020   Hypercalcemia 08/11/2020   Cerebral aneurysm, nonruptured 11/27/2019   Hypertensive urgency 09/14/2019   Hypokalemia    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 [address] [place], [place], [address] Phone: [phone]   Fax:  [phone]  Name: ALLIA WILTSEY MRN: [id number] Date of Birth: [birth year]

## 2021-05-19 ENCOUNTER — Ambulatory Visit (HOSPITAL_COMMUNITY): Payer: 59

## 2021-05-19 ENCOUNTER — Other Ambulatory Visit: Payer: Self-pay

## 2021-05-19 ENCOUNTER — Encounter (HOSPITAL_COMMUNITY): Payer: Self-pay

## 2021-05-19 DIAGNOSIS — M6281 Muscle weakness (generalized): Secondary | ICD-10-CM

## 2021-05-19 DIAGNOSIS — M545 Low back pain, unspecified: Secondary | ICD-10-CM

## 2021-05-19 NOTE — Therapy (Signed)
[facility] [address] [place], [place], [address] Phone: [phone]   Fax:  [phone]  Physical Therapy Treatment  Patient Details  Name: Darlene [name] MRN: [id number] Date of Birth: Jun 21, 1961 Referring Provider (PT): Dr. Sharolyn Douglas   Encounter Date: 05/19/2021   PT End of Session - 05/19/21 1312     Visit Number 5    Number of Visits 12    Date for PT Re-Evaluation 06/17/21    Authorization Type Generic [employer], "visits in excess of 30 visits will be reviewed for continued medical necessity"    PT Start Time 1305    PT Stop Time 1345    PT Time Calculation (min) 40 min    Activity Tolerance Patient tolerated treatment well    Behavior During Therapy [facility] for tasks assessed/performed             Past Medical History:  Diagnosis Date   Anemia    Arthritis    Asthma    Activity induced   Essential hypertension, benign    Family history of adverse reaction to anesthesia    Mom and daughter had difficulty waking up post surgery.  Mother's body temperature dropped during and after surgery.    Headache    History of vertigo    Impaired fasting glucose    Thyroid nodule     Past Surgical History:  Procedure Laterality Date   ABDOMINAL HYSTERECTOMY     CHOLECYSTECTOMY OPEN     IR ANGIO INTRA EXTRACRAN SEL INTERNAL CAROTID BILAT MOD SED  10/16/2019   IR ANGIO INTRA EXTRACRAN SEL INTERNAL CAROTID UNI R MOD SED  11/27/2019   IR ANGIO INTRA EXTRACRAN SEL INTERNAL CAROTID UNI R MOD SED  05/22/2020   IR ANGIO VERTEBRAL SEL VERTEBRAL BILAT MOD SED  10/16/2019   IR ANGIOGRAM FOLLOW UP STUDY  11/27/2019   IR TRANSCATH/EMBOLIZ  11/27/2019   Left hand surgery     PLACEMENT OF BREAST IMPLANTS     RADIOLOGY WITH ANESTHESIA N/A 11/27/2019   Procedure: Surpass embolization of right carotid aneurysm;  Surgeon: Lisbeth Renshaw, MD;  Location: [facility] OR;  Service: Radiology;  Laterality: N/A;    There were no vitals filed for this visit.    Subjective Assessment - 05/19/21 1307     Subjective Pt reports low back spasms have subsided and notes feeling some improvement    Limitations Sitting;Lifting;Standing;House hold activities    How long can you sit comfortably? 10 minutes    How long can you stand comfortably? 10 minutes    How long can you walk comfortably? "I can walk more than anything."    Diagnostic tests x-ray and MRI: reveals OA, bone spurs, DDD    Pain Onset More than a month ago                               [facility] Adult PT Treatment/Exercise - 05/19/21 0001       Lumbar Exercises: Standing   Lifting From floor;15 reps;Weights    Lifting Weights (lbs) 8    Row Strengthening;Both;20 reps;Theraband    Theraband Level (Row) Level 3 (Green)    Row Limitations hi-position    Shoulder Extension Strengthening;Both;20 reps;Theraband    Theraband Level (Shoulder Extension) Level 3 (Green)    Shoulder Extension Limitations staright arm pull down    Other Standing Lumbar Exercises paloff press 2x10 with green t-band  Other Standing Lumbar Exercises single leg balance and passing 4 lbs db around body 1x10,8      Lumbar Exercises: Seated   Sit to Stand 10 reps   + 4 lbs dumbells                   PT Education - 05/19/21 1344     Education Details education/demonstration/practice of deadlift form for lifting objects from ground    Person(s) Educated Patient    Methods Explanation;Demonstration    Comprehension Verbalized understanding;Returned demonstration              PT Short Term Goals - 05/06/21 1207       PT SHORT TERM GOAL #1   Title Patient will be independent with HEP in order to improve functional outcomes.    Time 3    Period Weeks    Status New    Target Date 05/27/21      PT SHORT TERM GOAL #2   Title Patient will report at least 25% improvement in symptoms for improved quality of life.    Time 3    Period Weeks    Status New    Target Date 05/27/21       PT SHORT TERM GOAL #3   Title Demo proper squat lift form to improve body mechanics to reduce pain during gardeneing activities    Baseline poor squat form    Time 3    Period Weeks    Status New    Target Date 05/27/21               PT Long Term Goals - 05/06/21 1208       PT LONG TERM GOAL #1   Title Demo trunk strength of 4/5 to improve lumbar stability when lifting    Baseline 3/5 extension, 2+/5 flexion    Time 6    Period Weeks    Status New    Target Date 06/17/21      PT LONG TERM GOAL #2   Title Patient will improve FOTO score by at least 5 points in order to indicate improved tolerance to activity.    Baseline 39% function    Time 6    Period Weeks    Status New    Target Date 06/17/21                   Plan - 05/19/21 1345     Clinical Impression Statement Able to progress with strengthening and functional lifts this session with increased focus/attention on proper deadlift of 8 lbs box to improve body mechanics requiring tactile and verbal cues for proper hip hinge and use of chair for posterior target with improved carryover at end of session.  Pt notes difficulty in her housework with lifting bags of soil/fertilizer from the ground.  Continued sesssions indicated to improve trunk strength and functional lifts to reduce risk for re-injury to low back    Personal Factors and Comorbidities Age;Time since onset of injury/illness/exacerbation;Past/Current Experience    Examination-Activity Limitations Carry;Lift;Stand;Stairs;Squat;Locomotion Level    Examination-Participation Restrictions Cleaning;Community Activity;Yard Work;Occupation    Stability/Clinical Decision Making Stable/Uncomplicated    Rehab Potential Good    PT Frequency 2x / week    PT Duration 6 weeks    PT Treatment/Interventions ADLs/Self Care Home Management;Aquatic Therapy;Electrical Stimulation;DME Instruction;Traction;Moist Heat;Gait training;Stair training;Functional mobility  training;Therapeutic activities;Therapeutic exercise;Balance training;Patient/family education;Neuromuscular re-education;Manual techniques;Spinal Manipulations;Joint Manipulations    PT Next Visit Plan Continue with core strengthening,  body mechanics for lifting from ground, farmer's carry (unilateral?)    PT Home Exercise Plan SKTC, DKTC, PPT 6/23 hip abd/ add isometric, bridge; prone TKE, DKC ball, LTR ball, stiff-arm pull down, high-row, paloff press    Consulted and Agree with Plan of Care Patient             Patient will benefit from skilled therapeutic intervention in order to improve the following deficits and impairments:  Decreased activity tolerance, Decreased strength, Decreased range of motion, Improper body mechanics, Pain  Visit Diagnosis: Chronic low back pain, unspecified back pain laterality, unspecified whether sciatica present  Muscle weakness (generalized)     Problem List Patient Active Problem List   Diagnosis Date Noted   Endocrine function study abnormality 09/09/2020   Hypercalcemia 08/11/2020   Cerebral aneurysm, nonruptured 11/27/2019   Hypertensive urgency 09/14/2019   Hypokalemia    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 Shary Decamp, PT, DPT Physical Therapist- [employer] Office Number: [phone]   [facility] [address] [place], [place], [address] Phone: [phone]   Fax:  [phone]  Name: MAYLEY LISH MRN: [id number] Date of Birth: 03/26/1961

## 2021-05-21 ENCOUNTER — Encounter (HOSPITAL_COMMUNITY): Payer: Self-pay | Admitting: Physical Therapy

## 2021-05-21 ENCOUNTER — Ambulatory Visit (HOSPITAL_COMMUNITY): Payer: 59 | Admitting: Physical Therapy

## 2021-05-21 ENCOUNTER — Other Ambulatory Visit: Payer: Self-pay

## 2021-05-21 DIAGNOSIS — M545 Low back pain, unspecified: Secondary | ICD-10-CM

## 2021-05-21 DIAGNOSIS — G8929 Other chronic pain: Secondary | ICD-10-CM

## 2021-05-21 DIAGNOSIS — M6281 Muscle weakness (generalized): Secondary | ICD-10-CM

## 2021-05-21 NOTE — Therapy (Signed)
[facility] [address] [place], [place], [address] Phone: [phone]   Fax:  [phone]  Physical Therapy Treatment  Patient Details  Name: Darlene [name] MRN: [id number] Date of Birth: 01/06/1961 Referring Provider (PT): Dr. Sharolyn Douglas   Encounter Date: 05/21/2021   PT End of Session - 05/21/21 1708     Visit Number 6    Number of Visits 12    Date for PT Re-Evaluation 06/17/21    Authorization Type Generic [employer], "visits in excess of 30 visits will be reviewed for continued medical necessity"    PT Start Time 1655    PT Stop Time 1735    PT Time Calculation (min) 40 min    Activity Tolerance Patient tolerated treatment well    Behavior During Therapy [facility] for tasks assessed/performed             Past Medical History:  Diagnosis Date   Anemia    Arthritis    Asthma    Activity induced   Essential hypertension, benign    Family history of adverse reaction to anesthesia    Mom and daughter had difficulty waking up post surgery.  Mother's body temperature dropped during and after surgery.    Headache    History of vertigo    Impaired fasting glucose    Thyroid nodule     Past Surgical History:  Procedure Laterality Date   ABDOMINAL HYSTERECTOMY     CHOLECYSTECTOMY OPEN     IR ANGIO INTRA EXTRACRAN SEL INTERNAL CAROTID BILAT MOD SED  10/16/2019   IR ANGIO INTRA EXTRACRAN SEL INTERNAL CAROTID UNI R MOD SED  11/27/2019   IR ANGIO INTRA EXTRACRAN SEL INTERNAL CAROTID UNI R MOD SED  05/22/2020   IR ANGIO VERTEBRAL SEL VERTEBRAL BILAT MOD SED  10/16/2019   IR ANGIOGRAM FOLLOW UP STUDY  11/27/2019   IR TRANSCATH/EMBOLIZ  11/27/2019   Left hand surgery     PLACEMENT OF BREAST IMPLANTS     RADIOLOGY WITH ANESTHESIA N/A 11/27/2019   Procedure: Surpass embolization of right carotid aneurysm;  Surgeon: Lisbeth Renshaw, MD;  Location: [facility] OR;  Service: Radiology;  Laterality: N/A;    There were no vitals filed for this visit.    Subjective Assessment - 05/21/21 1700     Subjective Patient states she "could feel it" last time. Notes some muscle soreness but feels this has been helpful. States she overdid it today with yard work. Notes some radiating pain in RT leg.    Limitations Sitting;Lifting;Standing;House hold activities    How long can you sit comfortably? 10 minutes    How long can you stand comfortably? 10 minutes    How long can you walk comfortably? "I can walk more than anything."    Diagnostic tests x-ray and MRI: reveals OA, bone spurs, DDD    Currently in Pain? Yes    Pain Score 2     Pain Location Back    Pain Orientation Lower    Pain Descriptors / Indicators Aching    Pain Type Chronic pain    Pain Onset More than a month ago    Pain Frequency Constant                               [facility] Adult PT Treatment/Exercise - 05/21/21 0001       Lumbar Exercises: Stretches   Prone on Elbows Stretch 3 reps;60 seconds  Lumbar Exercises: Aerobic   Recumbent Bike 5 min warmup Lv 2      Lumbar Exercises: Machines for Strengthening   Other Lumbar Machine Exercise machine deadlifts 40# x10      Lumbar Exercises: Standing   Row Strengthening;Both;20 reps;Theraband    Theraband Level (Row) Level 3 (Green)    Row Limitations Hi position and standard    Shoulder Extension Strengthening;Both;20 reps;Theraband    Theraband Level (Shoulder Extension) Level 3 (Green)    Other Standing Lumbar Exercises paloff press x10 with green t-band, palloff walkouts x10 GTB                      PT Short Term Goals - 05/06/21 1207       PT SHORT TERM GOAL #1   Title Patient will be independent with HEP in order to improve functional outcomes.    Time 3    Period Weeks    Status New    Target Date 05/27/21      PT SHORT TERM GOAL #2   Title Patient will report at least 25% improvement in symptoms for improved quality of life.    Time 3    Period Weeks    Status New     Target Date 05/27/21      PT SHORT TERM GOAL #3   Title Demo proper squat lift form to improve body mechanics to reduce pain during gardeneing activities    Baseline poor squat form    Time 3    Period Weeks    Status New    Target Date 05/27/21               PT Long Term Goals - 05/06/21 1208       PT LONG TERM GOAL #1   Title Demo trunk strength of 4/5 to improve lumbar stability when lifting    Baseline 3/5 extension, 2+/5 flexion    Time 6    Period Weeks    Status New    Target Date 06/17/21      PT LONG TERM GOAL #2   Title Patient will improve FOTO score by at least 5 points in order to indicate improved tolerance to activity.    Baseline 39% function    Time 6    Period Weeks    Status New    Target Date 06/17/21                   Plan - 05/21/21 1743     Clinical Impression Statement Patient noting increased tension in lumbar upon arrival today. Performed dynamic warmup, patient notes some discomfort through RLE. Added prone on elbows stretch which resolved radicular symptoms and decreased lumbar tension. Educated patient on mechanism and implications. Continued with core strengthening using bands, and lifting mechanics with added machine deadlifts. Patient tolerated this well, showing good return. Added prone on elbows stretch to HEP. Will continue to progress as tolerated.    Personal Factors and Comorbidities Age;Time since onset of injury/illness/exacerbation;Past/Current Experience    Examination-Activity Limitations Carry;Lift;Stand;Stairs;Squat;Locomotion Level    Examination-Participation Restrictions Cleaning;Community Activity;Yard Work;Occupation    Stability/Clinical Decision Making Stable/Uncomplicated    Rehab Potential Good    PT Frequency 2x / week    PT Duration 6 weeks    PT Treatment/Interventions ADLs/Self Care Home Management;Aquatic Therapy;Electrical Stimulation;DME Instruction;Traction;Moist Heat;Gait training;Stair  training;Functional mobility training;Therapeutic activities;Therapeutic exercise;Balance training;Patient/family education;Neuromuscular re-education;Manual techniques;Spinal Manipulations;Joint Manipulations    PT Next Visit Plan Continue with  core strengthening, body mechanics for lifting from ground, farmer's carry (unilateral?)    PT Home Exercise Plan SKTC, DKTC, PPT 6/23 hip abd/ add isometric, bridge; prone TKE, DKC ball, LTR ball, stiff-arm pull down, high-row, paloff press 7/7 POE    Consulted and Agree with Plan of Care Patient             Patient will benefit from skilled therapeutic intervention in order to improve the following deficits and impairments:  Decreased activity tolerance, Decreased strength, Decreased range of motion, Improper body mechanics, Pain  Visit Diagnosis: Chronic low back pain, unspecified back pain laterality, unspecified whether sciatica present  Muscle weakness (generalized)     Problem List Patient Active Problem List   Diagnosis Date Noted   Endocrine function study abnormality 09/09/2020   Hypercalcemia 08/11/2020   Cerebral aneurysm, nonruptured 11/27/2019   Hypertensive urgency 09/14/2019   Hypokalemia    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

## 2021-05-21 NOTE — Patient Instructions (Signed)
Access Code: [email or web address] URL: [email or web address] Date: 05/21/2021 Prepared by: Georges Lynch  Exercises Static Prone on Elbows - 1-2 x daily - 7 x weekly - 1 sets - 1 reps - 2-5 minutes hold

## 2021-05-26 ENCOUNTER — Ambulatory Visit (HOSPITAL_COMMUNITY): Payer: 59

## 2021-05-26 ENCOUNTER — Encounter (HOSPITAL_COMMUNITY): Payer: Self-pay

## 2021-05-26 ENCOUNTER — Other Ambulatory Visit: Payer: Self-pay

## 2021-05-26 ENCOUNTER — Telehealth (HOSPITAL_COMMUNITY): Payer: Self-pay

## 2021-05-26 DIAGNOSIS — M545 Low back pain, unspecified: Secondary | ICD-10-CM

## 2021-05-26 DIAGNOSIS — G8929 Other chronic pain: Secondary | ICD-10-CM

## 2021-05-26 DIAGNOSIS — M6281 Muscle weakness (generalized): Secondary | ICD-10-CM

## 2021-05-26 NOTE — Therapy (Signed)
[facility] [address] [place], [place], [address] Phone: [phone]   Fax:  [phone]  Physical Therapy Treatment  Patient Details  Name: Darlene [name] MRN: [id number] Date of Birth: Jan 31, 1961 Referring Provider (PT): Dr. Sharolyn Douglas   Encounter Date: 05/26/2021   PT End of Session - 05/26/21 1010     Visit Number 7    Number of Visits 12    Date for PT Re-Evaluation 06/17/21    Authorization Type Generic [employer], "visits in excess of 30 visits will be reviewed for continued medical necessity"    PT Start Time 1004    PT Stop Time 1042    PT Time Calculation (min) 38 min    Activity Tolerance Patient tolerated treatment well    Behavior During Therapy [facility] for tasks assessed/performed             Past Medical History:  Diagnosis Date   Anemia    Arthritis    Asthma    Activity induced   Essential hypertension, benign    Family history of adverse reaction to anesthesia    Mom and daughter had difficulty waking up post surgery.  Mother's body temperature dropped during and after surgery.    Headache    History of vertigo    Impaired fasting glucose    Thyroid nodule     Past Surgical History:  Procedure Laterality Date   ABDOMINAL HYSTERECTOMY     CHOLECYSTECTOMY OPEN     IR ANGIO INTRA EXTRACRAN SEL INTERNAL CAROTID BILAT MOD SED  10/16/2019   IR ANGIO INTRA EXTRACRAN SEL INTERNAL CAROTID UNI R MOD SED  11/27/2019   IR ANGIO INTRA EXTRACRAN SEL INTERNAL CAROTID UNI R MOD SED  05/22/2020   IR ANGIO VERTEBRAL SEL VERTEBRAL BILAT MOD SED  10/16/2019   IR ANGIOGRAM FOLLOW UP STUDY  11/27/2019   IR TRANSCATH/EMBOLIZ  11/27/2019   Left hand surgery     PLACEMENT OF BREAST IMPLANTS     RADIOLOGY WITH ANESTHESIA N/A 11/27/2019   Procedure: Surpass embolization of right carotid aneurysm;  Surgeon: Lisbeth Renshaw, MD;  Location: [facility] OR;  Service: Radiology;  Laterality: N/A;    There were no vitals filed for this visit.    Subjective Assessment - 05/26/21 1005     Subjective Pt stated she over did it yesterday, cleaned a house for 12 hours.  Reports she had increased pain in hips and radicular symptoms down to knees anteriorly.    Currently in Pain? Yes    Pain Score 4     Pain Location Back    Pain Orientation Lower    Pain Descriptors / Indicators Sore;Aching    Pain Type Chronic pain    Pain Radiating Towards LLE buttocks, knee, and wraps to left groin    Pain Onset More than a month ago    Pain Frequency Constant    Aggravating Factors  sitting, bending backward, carrying >5 lbs    Pain Relieving Factors laying flat on back on wooden floor, wearing SI lock belt    Effect of Pain on Daily Activities limits                               [facility] Adult PT Treatment/Exercise - 05/26/21 0001       Balance Poses: Yoga   Warrior I 2 reps;30 seconds    Warrior II 1 rep;30 seconds  Lumbar Exercises: Standing   Other Standing Lumbar Exercises 3D hip excursion    Other Standing Lumbar Exercises Yoga: forward fold, cat/camel, quadruped UE/LE 10x5", child's pose 3x 30" downward dog 5x 10"      Lumbar Exercises: Quadruped   Madcat/Old Horse 5 reps    Madcat/Old Horse Limitations 10"    Single Arm Raise Right;Left;5 reps;5 seconds    Straight Leg Raise 5 reps;5 seconds    Opposite Arm/Leg Raise Right arm/Left leg;Left arm/Right leg;5 reps;5 seconds    Other Quadruped Lumbar Exercises child's pose 3x 30"                      PT Short Term Goals - 05/06/21 1207       PT SHORT TERM GOAL #1   Title Patient will be independent with HEP in order to improve functional outcomes.    Time 3    Period Weeks    Status New    Target Date 05/27/21      PT SHORT TERM GOAL #2   Title Patient will report at least 25% improvement in symptoms for improved quality of life.    Time 3    Period Weeks    Status New    Target Date 05/27/21      PT SHORT TERM GOAL #3   Title Demo  proper squat lift form to improve body mechanics to reduce pain during gardeneing activities    Baseline poor squat form    Time 3    Period Weeks    Status New    Target Date 05/27/21               PT Long Term Goals - 05/06/21 1208       PT LONG TERM GOAL #1   Title Demo trunk strength of 4/5 to improve lumbar stability when lifting    Baseline 3/5 extension, 2+/5 flexion    Time 6    Period Weeks    Status New    Target Date 06/17/21      PT LONG TERM GOAL #2   Title Patient will improve FOTO score by at least 5 points in order to indicate improved tolerance to activity.    Baseline 39% function    Time 6    Period Weeks    Status New    Target Date 06/17/21                   Plan - 05/26/21 1027     Clinical Impression Statement Pt limited by soreness/stiffness.  Session focus on mobility and core/proximal strengthening.  Added 3D hip excursion and yoga positions for mobility and core strengthening.  Pt reports radicular symptoms resolved following extension based exercises.  Pt reports pain reduced to 2/10 at EOS and no radicular symptoms following.    Personal Factors and Comorbidities Age;Time since onset of injury/illness/exacerbation;Past/Current Experience    Examination-Activity Limitations Carry;Lift;Stand;Stairs;Squat;Locomotion Level    Examination-Participation Restrictions Cleaning;Community Activity;Yard Work;Occupation    Stability/Clinical Decision Making Stable/Uncomplicated    Clinical Decision Making Low    Rehab Potential Good    PT Frequency 2x / week    PT Duration 6 weeks    PT Treatment/Interventions ADLs/Self Care Home Management;Aquatic Therapy;Electrical Stimulation;DME Instruction;Traction;Moist Heat;Gait training;Stair training;Functional mobility training;Therapeutic activities;Therapeutic exercise;Balance training;Patient/family education;Neuromuscular re-education;Manual techniques;Spinal Manipulations;Joint Manipulations     PT Next Visit Plan Continue with core strengthening, body mechanics for lifting from ground, farmer's carry (unilateral?)  PT Home Exercise Plan SKTC, DKTC, PPT 6/23 hip abd/ add isometric, bridge; prone TKE, DKC ball, LTR ball, stiff-arm pull down, high-row, paloff press 7/7 POE: 7/12: quadruped UE/LE, child's pose    Consulted and Agree with Plan of Care Patient             Patient will benefit from skilled therapeutic intervention in order to improve the following deficits and impairments:  Decreased activity tolerance, Decreased strength, Decreased range of motion, Improper body mechanics, Pain  Visit Diagnosis: Muscle weakness (generalized)  Chronic low back pain, unspecified back pain laterality, unspecified whether sciatica present     Problem List Patient Active Problem List   Diagnosis Date Noted   Endocrine function study abnormality 09/09/2020   Hypercalcemia 08/11/2020   Cerebral aneurysm, nonruptured 11/27/2019   Hypertensive urgency 09/14/2019   Hypokalemia    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

## 2021-05-26 NOTE — Telephone Encounter (Signed)
No show #1.  Attempted to call with no answer and mailbox full.  Unable to leave message concerning missed apt today.   Becky Sax, LPTA/CLT; [place]ack [phone]

## 2021-05-28 ENCOUNTER — Encounter (HOSPITAL_COMMUNITY): Payer: Self-pay | Admitting: Physical Therapy

## 2021-05-28 ENCOUNTER — Ambulatory Visit (HOSPITAL_COMMUNITY): Payer: 59 | Admitting: Physical Therapy

## 2021-05-28 ENCOUNTER — Other Ambulatory Visit: Payer: Self-pay

## 2021-05-28 DIAGNOSIS — M6281 Muscle weakness (generalized): Secondary | ICD-10-CM

## 2021-05-28 DIAGNOSIS — G8929 Other chronic pain: Secondary | ICD-10-CM

## 2021-05-28 DIAGNOSIS — M545 Low back pain, unspecified: Secondary | ICD-10-CM | POA: Diagnosis not present

## 2021-05-28 NOTE — Therapy (Signed)
[facility] [address] [place], [place], [address] Phone: [phone]   Fax:  [phone]  Physical Therapy Treatment  Patient Details  Name: Darlene [name] MRN: [id number] Date of Birth: 1961/03/05 Referring Provider (PT): Dr. Sharolyn Douglas   Encounter Date: 05/28/2021   PT End of Session - 05/28/21 0922     Visit Number 8    Number of Visits 12    Date for PT Re-Evaluation 06/17/21    Authorization Type Generic [employer], "visits in excess of 30 visits will be reviewed for continued medical necessity"    PT Start Time 0925   late to check in and then wa paying accounts   PT Stop Time 0955    PT Time Calculation (min) 30 min    Activity Tolerance Patient tolerated treatment well    Behavior During Therapy [facility] for tasks assessed/performed             Past Medical History:  Diagnosis Date   Anemia    Arthritis    Asthma    Activity induced   Essential hypertension, benign    Family history of adverse reaction to anesthesia    Mom and daughter had difficulty waking up post surgery.  Mother's body temperature dropped during and after surgery.    Headache    History of vertigo    Impaired fasting glucose    Thyroid nodule     Past Surgical History:  Procedure Laterality Date   ABDOMINAL HYSTERECTOMY     CHOLECYSTECTOMY OPEN     IR ANGIO INTRA EXTRACRAN SEL INTERNAL CAROTID BILAT MOD SED  10/16/2019   IR ANGIO INTRA EXTRACRAN SEL INTERNAL CAROTID UNI R MOD SED  11/27/2019   IR ANGIO INTRA EXTRACRAN SEL INTERNAL CAROTID UNI R MOD SED  05/22/2020   IR ANGIO VERTEBRAL SEL VERTEBRAL BILAT MOD SED  10/16/2019   IR ANGIOGRAM FOLLOW UP STUDY  11/27/2019   IR TRANSCATH/EMBOLIZ  11/27/2019   Left hand surgery     PLACEMENT OF BREAST IMPLANTS     RADIOLOGY WITH ANESTHESIA N/A 11/27/2019   Procedure: Surpass embolization of right carotid aneurysm;  Surgeon: Lisbeth Renshaw, MD;  Location: [facility] OR;  Service: Radiology;  Laterality: N/A;     There were no vitals filed for this visit.   Subjective Assessment - 05/28/21 0937     Subjective States that she is in more pain today. Woke up at 1am and tried her extension based exercises but this did not help and normally it does help. Reports she has aching and burning in her hips.    Currently in Pain? Yes    Pain Score 6     Pain Location Back    Pain Orientation Lower    Pain Descriptors / Indicators Aching;Sore    Pain Type Chronic pain    Pain Radiating Towards into buttocks and hips    Pain Onset More than a month ago                [facility] PT Assessment - 05/28/21 0001       Assessment   Medical Diagnosis M47.896 Spondylosis    Referring Provider (PT) Dr. Sharolyn Douglas                           [facility] Adult PT Treatment/Exercise - 05/28/21 0001       Lumbar Exercises: Stretches   Double Knee to Chest Stretch --  Lower Trunk Rotation --   3 minutes x2 with ball     Lumbar Exercises: Supine   Other Supine Lumbar Exercises self traction with hands in hook lying 3 minutes    Other Supine Lumbar Exercises hamstring isometric on ball 2 minutes      Lumbar Exercises: Prone   Other Prone Lumbar Exercises Prone press up 3 minutes total      Modalities   Modalities Moist Heat      Moist Heat Therapy   Number Minutes Moist Heat 12 Minutes    Moist Heat Location Lumbar Spine   during traction and supine stretches     Manual Therapy   Manual Therapy Manual Traction    Manual therapy comments completed separate from all other interventions    Manual Traction manual lumbar traction to decrease pain - wiht ball -- felt good - 8 minutes                    PT Education - 05/28/21 0937     Education Details on importance of performing stretches intermitternetly throughout the day so pain doesn't compound. on anatomy and exercise rationale    Person(s) Educated Patient    Methods Explanation    Comprehension Verbalized understanding               PT Short Term Goals - 05/06/21 1207       PT SHORT TERM GOAL #1   Title Patient will be independent with HEP in order to improve functional outcomes.    Time 3    Period Weeks    Status New    Target Date 05/27/21      PT SHORT TERM GOAL #2   Title Patient will report at least 25% improvement in symptoms for improved quality of life.    Time 3    Period Weeks    Status New    Target Date 05/27/21      PT SHORT TERM GOAL #3   Title Demo proper squat lift form to improve body mechanics to reduce pain during gardeneing activities    Baseline poor squat form    Time 3    Period Weeks    Status New    Target Date 05/27/21               PT Long Term Goals - 05/06/21 1208       PT LONG TERM GOAL #1   Title Demo trunk strength of 4/5 to improve lumbar stability when lifting    Baseline 3/5 extension, 2+/5 flexion    Time 6    Period Weeks    Status New    Target Date 06/17/21      PT LONG TERM GOAL #2   Title Patient will improve FOTO score by at least 5 points in order to indicate improved tolerance to activity.    Baseline 39% function    Time 6    Period Weeks    Status New    Target Date 06/17/21                   Plan - 05/28/21 0953     Clinical Impression Statement Patient with increase in back symptoms on this date. Unloaded ROM exercises were tolerated well as well as traction. Provided patient with self traction technique to perform at home and instructed patient to perform extension based exercises intermittently throughout the day so symptoms don't compound on each other.  Reduced symptoms noted end of session reporting 2/10 in back.    Personal Factors and Comorbidities Age;Time since onset of injury/illness/exacerbation;Past/Current Experience    Examination-Activity Limitations Carry;Lift;Stand;Stairs;Squat;Locomotion Level    Examination-Participation Restrictions Cleaning;Community Activity;Yard Work;Occupation     Stability/Clinical Decision Making Stable/Uncomplicated    Rehab Potential Good    PT Frequency 2x / week    PT Duration 6 weeks    PT Treatment/Interventions ADLs/Self Care Home Management;Aquatic Therapy;Electrical Stimulation;DME Instruction;Traction;Moist Heat;Gait training;Stair training;Functional mobility training;Therapeutic activities;Therapeutic exercise;Balance training;Patient/family education;Neuromuscular re-education;Manual techniques;Spinal Manipulations;Joint Manipulations    PT Next Visit Plan Continue with core strengthening, body mechanics for lifting from ground, farmer's carry (unilateral?)    PT Home Exercise Plan SKTC, DKTC, PPT 6/23 hip abd/ add isometric, bridge; prone TKE, DKC ball, LTR ball, stiff-arm pull down, high-row, paloff press 7/7 POE: 7/12: quadruped UE/LE, child's pose; 7/14 self traction    Consulted and Agree with Plan of Care Patient             Patient will benefit from skilled therapeutic intervention in order to improve the following deficits and impairments:  Decreased activity tolerance, Decreased strength, Decreased range of motion, Improper body mechanics, Pain  Visit Diagnosis: Muscle weakness (generalized)  Chronic low back pain, unspecified back pain laterality, unspecified whether sciatica present     Problem List Patient Active Problem List   Diagnosis Date Noted   Endocrine function study abnormality 09/09/2020   Hypercalcemia 08/11/2020   Cerebral aneurysm, nonruptured 11/27/2019   Hypertensive urgency 09/14/2019   Hypokalemia    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 [place],  [place], [address] Phone: [phone]   Fax:  [phone]  Name: RASHIDA LADOUCEUR MRN: [id number] Date of Birth: 02/27/1961

## 2021-06-02 ENCOUNTER — Encounter (HOSPITAL_COMMUNITY): Payer: Self-pay

## 2021-06-02 ENCOUNTER — Ambulatory Visit (HOSPITAL_COMMUNITY): Payer: 59

## 2021-06-02 ENCOUNTER — Other Ambulatory Visit: Payer: Self-pay

## 2021-06-02 DIAGNOSIS — M6281 Muscle weakness (generalized): Secondary | ICD-10-CM

## 2021-06-02 DIAGNOSIS — M545 Low back pain, unspecified: Secondary | ICD-10-CM | POA: Diagnosis not present

## 2021-06-02 DIAGNOSIS — G8929 Other chronic pain: Secondary | ICD-10-CM

## 2021-06-02 NOTE — Therapy (Signed)
[facility] [address] [place], [place], [address] Phone: [phone]   Fax:  [phone]  Physical Therapy Treatment  Patient Details  Name: Darlene [name] MRN: [id number] Date of Birth: [birth year] Referring Provider (PT): Dr. Sharolyn Douglas   Encounter Date: 06/02/2021   PT End of Session - 06/02/21 0930     Visit Number 9    Number of Visits 12    Date for PT Re-Evaluation 06/17/21    Authorization Type Generic [employer], "visits in excess of 30 visits will be reviewed for continued medical necessity"    Progress Note Due on Visit 10    PT Start Time 0919    PT Stop Time 0958    PT Time Calculation (min) 39 min    Activity Tolerance Patient tolerated treatment well;No increased pain    Behavior During Therapy WFL for tasks assessed/performed             Past Medical History:  Diagnosis Date   Anemia    Arthritis    Asthma    Activity induced   Essential hypertension, benign    Family history of adverse reaction to anesthesia    Mom and daughter had difficulty waking up post surgery.  Mother's body temperature dropped during and after surgery.    Headache    History of vertigo    Impaired fasting glucose    Thyroid nodule     Past Surgical History:  Procedure Laterality Date   ABDOMINAL HYSTERECTOMY     CHOLECYSTECTOMY OPEN     IR ANGIO INTRA EXTRACRAN SEL INTERNAL CAROTID BILAT MOD SED  10/16/2019   IR ANGIO INTRA EXTRACRAN SEL INTERNAL CAROTID UNI R MOD SED  11/27/2019   IR ANGIO INTRA EXTRACRAN SEL INTERNAL CAROTID UNI R MOD SED  05/22/2020   IR ANGIO VERTEBRAL SEL VERTEBRAL BILAT MOD SED  10/16/2019   IR ANGIOGRAM FOLLOW UP STUDY  11/27/2019   IR TRANSCATH/EMBOLIZ  11/27/2019   Left hand surgery     PLACEMENT OF BREAST IMPLANTS     RADIOLOGY WITH ANESTHESIA N/A 11/27/2019   Procedure: Surpass embolization of right carotid aneurysm;  Surgeon: Lisbeth Renshaw, MD;  Location: [facility] OR;  Service: Radiology;  Laterality: N/A;     There were no vitals filed for this visit.   Subjective Assessment - 06/02/21 0922     Subjective Pt saw MD this morning, brought referral to continue therapy for pain relief, increase function, activities of ADL, education.  Pt reports she enjoyed the yoga moves and plans to begin classes.  Currently pain scale 3/10 Rt lower back stiff and sharp.    Currently in Pain? Yes    Pain Score 3     Pain Location Back    Pain Orientation Lower;Right    Pain Descriptors / Indicators Sharp;Tightness    Pain Type Chronic pain    Pain Onset More than a month ago    Pain Frequency Constant    Aggravating Factors  sitting, bending backward, carrying >5lbs    Pain Relieving Factors laying flat on back on wooden floor, wearing SI lock belt    Effect of Pain on Daily Activities limits                [facility] PT Assessment - 06/02/21 0001       Assessment   Medical Diagnosis M47.896 Spondylosis    Referring Provider (PT) Dr. Sharolyn Douglas    Next MD Visit 6 weeks, waiting for  MRI call    Prior Therapy none for her back                           [facility] Adult PT Treatment/Exercise - 06/02/21 0001       Lumbar Exercises: Stretches   Lower Trunk Rotation Limitations on green ball 10x 10"      Lumbar Exercises: Standing   Functional Squats 10 reps    Functional Squats Limitations cueing for equal weight bearing, reduce twist to Lt upon standing    Lifting From floor;10 reps    Lifting Limitations lifting orange weight ball from floor and 12in height, cueing for mechanics      Lumbar Exercises: Quadruped   Madcat/Old Horse 10 reps    Madcat/Old Horse Limitations 10"    Opposite Arm/Leg Raise Right arm/Left leg;Left arm/Right leg;5 seconds;10 reps    Other Quadruped Lumbar Exercises prone, prone of elbow to pressup, downward dog, plank flow 4RT      Manual Therapy   Manual Therapy Manual Traction    Manual therapy comments completed separate from all other interventions     Manual Traction manual lumbar traction to decrease pain - wiht ball -- felt good - 8 minutes                      PT Short Term Goals - 05/06/21 1207       PT SHORT TERM GOAL #1   Title Patient will be independent with HEP in order to improve functional outcomes.    Time 3    Period Weeks    Status New    Target Date 05/27/21      PT SHORT TERM GOAL #2   Title Patient will report at least 25% improvement in symptoms for improved quality of life.    Time 3    Period Weeks    Status New    Target Date 05/27/21      PT SHORT TERM GOAL #3   Title Demo proper squat lift form to improve body mechanics to reduce pain during gardeneing activities    Baseline poor squat form    Time 3    Period Weeks    Status New    Target Date 05/27/21               PT Long Term Goals - 05/06/21 1208       PT LONG TERM GOAL #1   Title Demo trunk strength of 4/5 to improve lumbar stability when lifting    Baseline 3/5 extension, 2+/5 flexion    Time 6    Period Weeks    Status New    Target Date 06/17/21      PT LONG TERM GOAL #2   Title Patient will improve FOTO score by at least 5 points in order to indicate improved tolerance to activity.    Baseline 39% function    Time 6    Period Weeks    Status New    Target Date 06/17/21                   Plan - 06/02/21 7253     Clinical Impression Statement Pt arrived with pain lower back and c/o stiffness initially this session.  Began session instructing yoga moves for flexibility and core strengthening as pt is interested in attending classes.  Pt encouraged to move slowly with all movements and in pain  free range.  Added plank flow and downward dog for core strengthening and mobility.  Reviewed proper lifting techniques with multimodal cueing for equal weight bearing and to reduce twisting upon standing.  EOS with manual traction.  EOS pt reports improved flexiblity and pain reduced to 1/10.    Personal Factors  and Comorbidities Age;Time since onset of injury/illness/exacerbation;Past/Current Experience    Examination-Activity Limitations Carry;Lift;Stand;Stairs;Squat;Locomotion Level    Examination-Participation Restrictions Cleaning;Community Activity;Yard Work;Occupation    Stability/Clinical Decision Making Stable/Uncomplicated    Clinical Decision Making Low    Rehab Potential Good    PT Frequency 2x / week    PT Duration 6 weeks    PT Treatment/Interventions ADLs/Self Care Home Management;Aquatic Therapy;Electrical Stimulation;DME Instruction;Traction;Moist Heat;Gait training;Stair training;Functional mobility training;Therapeutic activities;Therapeutic exercise;Balance training;Patient/family education;Neuromuscular re-education;Manual techniques;Spinal Manipulations;Joint Manipulations    PT Next Visit Plan 10th visit progress note.  Continue with core strengthening, body mechanics for lifting from ground, farmer's carry (unilateral?)    PT Home Exercise Plan SKTC, DKTC, PPT 6/23 hip abd/ add isometric, bridge; prone TKE, DKC ball, LTR ball, stiff-arm pull down, high-row, paloff press 7/7 POE: 7/12: quadruped UE/LE, child's pose; 7/14 self traction    Consulted and Agree with Plan of Care Patient             Patient will benefit from skilled therapeutic intervention in order to improve the following deficits and impairments:  Decreased activity tolerance, Decreased strength, Decreased range of motion, Improper body mechanics, Pain  Visit Diagnosis: Muscle weakness (generalized)  Chronic low back pain, unspecified back pain laterality, unspecified whether sciatica present     Problem List Patient Active Problem List   Diagnosis Date Noted   Endocrine function study abnormality 09/09/2020   Hypercalcemia 08/11/2020   Cerebral aneurysm, nonruptured 11/27/2019   Hypertensive urgency 09/14/2019   Hypokalemia    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 [place], [place], [address] Phone: [phone]   Fax:  [phone]  Name: NERA HAWORTH MRN: [id number] Date of Birth: Feb 27, 1961

## 2021-06-04 ENCOUNTER — Other Ambulatory Visit: Payer: Self-pay

## 2021-06-04 ENCOUNTER — Ambulatory Visit (HOSPITAL_COMMUNITY): Payer: 59 | Admitting: Physical Therapy

## 2021-06-04 ENCOUNTER — Encounter (HOSPITAL_COMMUNITY): Payer: Self-pay | Admitting: Physical Therapy

## 2021-06-04 DIAGNOSIS — M6281 Muscle weakness (generalized): Secondary | ICD-10-CM

## 2021-06-04 DIAGNOSIS — M545 Low back pain, unspecified: Secondary | ICD-10-CM

## 2021-06-04 DIAGNOSIS — G8929 Other chronic pain: Secondary | ICD-10-CM

## 2021-06-04 NOTE — Therapy (Signed)
[facility] [address] [place], [place], [address] Phone: [phone]   Fax:  [phone]  Physical Therapy Treatment and Progress Note  Patient Details  Name: Darlene [name] MRN: [id number] Date of Birth: September 12, 1961 Referring Provider (PT): Dr. Rennis Harding  Progress Note Reporting Period 05/06/21 to 06/04/21  See note below for Objective Data and Assessment of Progress/Goals.       Encounter Date: 06/04/2021   PT End of Session - 06/04/21 0924     Visit Number 10    Number of Visits 14    Date for PT Re-Evaluation 07/02/21    Authorization Type Generic [employer], "visits in excess of 30 visits will be reviewed for continued medical necessity"    Progress Note Due on Visit 20    PT Start Time 0925   late to apt   PT Stop Time 0955    PT Time Calculation (min) 30 min    Activity Tolerance Patient tolerated treatment well;No increased pain    Behavior During Therapy WFL for tasks assessed/performed             Past Medical History:  Diagnosis Date   Anemia    Arthritis    Asthma    Activity induced   Essential hypertension, benign    Family history of adverse reaction to anesthesia    Mom and daughter had difficulty waking up post surgery.  Mother's body temperature dropped during and after surgery.    Headache    History of vertigo    Impaired fasting glucose    Thyroid nodule     Past Surgical History:  Procedure Laterality Date   ABDOMINAL HYSTERECTOMY     CHOLECYSTECTOMY OPEN     IR ANGIO INTRA EXTRACRAN SEL INTERNAL CAROTID BILAT MOD SED  10/16/2019   IR ANGIO INTRA EXTRACRAN SEL INTERNAL CAROTID UNI R MOD SED  11/27/2019   IR ANGIO INTRA EXTRACRAN SEL INTERNAL CAROTID UNI R MOD SED  05/22/2020   IR ANGIO VERTEBRAL SEL VERTEBRAL BILAT MOD SED  10/16/2019   IR ANGIOGRAM FOLLOW UP STUDY  11/27/2019   IR TRANSCATH/EMBOLIZ  11/27/2019   Left hand surgery     PLACEMENT OF BREAST IMPLANTS     RADIOLOGY WITH ANESTHESIA N/A  11/27/2019   Procedure: Surpass embolization of right carotid aneurysm;  Surgeon: Consuella Lose, MD;  Location: [facility];  Service: Radiology;  Laterality: N/A;    There were no vitals filed for this visit.   Subjective Assessment - 06/04/21 0927     Subjective States that today she is doing good. She had some pain this morning and she took some medication and feels good current pain is 1/10 in her back and hips. States she had tingling in her legs last night and woke up 2-3 times last night.    Currently in Pain? Yes    Pain Score 1     Pain Location Back    Pain Orientation Lower;Right;Left    Pain Descriptors / Indicators Tightness    Pain Onset More than a month ago                [facility] PT Assessment - 06/04/21 0001       Assessment   Medical Diagnosis M47.896 Spondylosis    Referring Provider (PT) Dr. Rennis Harding      Observation/Other Assessments   Focus on Therapeutic Outcomes (FOTO)  50% function   initial 40% limited     AROM  Lumbar Flexion WNL    Lumbar Extension 0% limited    Lumbar - Right Side Bend WNL    Lumbar - Left Side Bend WNL    Lumbar - Right Rotation WNL    Lumbar - Left Rotation WNL                           [facility] Adult PT Treatment/Exercise - 06/04/21 0001       Lumbar Exercises: Prone   Other Prone Lumbar Exercises Prone press up 3 minutes total    Other Prone Lumbar Exercises up dog and childs pose 2 minute      Lumbar Exercises: Quadruped   Madcat/Old Horse Limitations 10" holds in each position- verbal and tactile cues - 3 minutes total                    PT Education - 06/04/21 0953     Education Details on FOTO score, on progress and goals.    Person(s) Educated Patient    Methods Explanation    Comprehension Verbalized understanding              PT Short Term Goals - 06/04/21 0932       PT SHORT TERM GOAL #1   Title Patient will be independent with HEP in order to improve functional  outcomes.    Baseline does exercises every other day    Time 3    Period Weeks    Status On-going    Target Date 05/27/21      PT SHORT TERM GOAL #2   Title Patient will report at least 25% improvement in symptoms for improved quality of life.    Baseline 30-60% better depends on day/activities    Time 3    Period Weeks    Status Achieved    Target Date 05/27/21      PT SHORT TERM GOAL #3   Title Demo proper squat lift form to improve body mechanics to reduce pain during gardeneing activities    Baseline poor squat form    Time 3    Period Weeks    Status On-going    Target Date 05/27/21               PT Long Term Goals - 06/04/21 0932       PT LONG TERM GOAL #1   Title Demo trunk strength of 4/5 to improve lumbar stability when lifting    Time 6    Period Weeks    Status On-going      PT LONG TERM GOAL #2   Title Patient will improve FOTO score by at least 5 points in order to indicate improved tolerance to activity.    Baseline was 40% now 50%    Time 6    Period Weeks    Status Achieved      PT LONG TERM GOAL #3   Title Patient will report participating in yoga at least 1x/week to improve overall flexibility    Time 4    Period Weeks    Status New    Target Date 07/02/21                   Plan - 06/04/21 7341     Clinical Impression Statement Extending POC additional 4 weeks at 1/week with patient transitioning to yoga one time a week. Answered all questions about current presentation. Patient has met 1/3 short  term goals and  long term goals. Added additional goal to POC. Patient continues to tolerate extension based exercises the best. Will continue to work on functional mobility and body mechanics as tolerated.    Personal Factors and Comorbidities Age;Time since onset of injury/illness/exacerbation;Past/Current Experience    Examination-Activity Limitations Carry;Lift;Stand;Stairs;Squat;Locomotion Level    Examination-Participation  Restrictions Cleaning;Community Activity;Yard Work;Occupation    Stability/Clinical Decision Making Stable/Uncomplicated    Rehab Potential Good    PT Frequency 1x / week    PT Duration 4 weeks    PT Treatment/Interventions ADLs/Self Care Home Management;Aquatic Therapy;Electrical Stimulation;DME Instruction;Traction;Moist Heat;Gait training;Stair training;Functional mobility training;Therapeutic activities;Therapeutic exercise;Balance training;Patient/family education;Neuromuscular re-education;Manual techniques;Spinal Manipulations;Joint Manipulations    PT Next Visit Plan Continue with core strengthening, body mechanics for lifting from ground, farmer's carry,    PT Home Exercise Plan [employer], DKTC, PPT 6/23 hip abd/ add isometric, bridge; prone TKE, DKC ball, LTR ball, stiff-arm pull down, high-row, paloff press 7/7 POE: 7/12: quadruped UE/LE, child's pose; 7/14 self traction    Consulted and Agree with Plan of Care Patient             Patient will benefit from skilled therapeutic intervention in order to improve the following deficits and impairments:  Decreased activity tolerance, Decreased strength, Decreased range of motion, Improper body mechanics, Pain  Visit Diagnosis: Muscle weakness (generalized) - Plan: PT plan of care cert/re-cert  Chronic low back pain, unspecified back pain laterality, unspecified whether sciatica present - Plan: PT plan of care cert/re-cert     Problem List Patient Active Problem List   Diagnosis Date Noted   Endocrine function study abnormality 09/09/2020   Hypercalcemia 08/11/2020   Cerebral aneurysm, nonruptured 11/27/2019   Hypertensive urgency 09/14/2019   Hypokalemia    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

## 2021-06-09 ENCOUNTER — Encounter (HOSPITAL_COMMUNITY): Payer: 59

## 2021-06-11 ENCOUNTER — Other Ambulatory Visit: Payer: Self-pay

## 2021-06-11 ENCOUNTER — Ambulatory Visit (HOSPITAL_COMMUNITY): Payer: 59 | Admitting: Physical Therapy

## 2021-06-11 DIAGNOSIS — M6281 Muscle weakness (generalized): Secondary | ICD-10-CM

## 2021-06-11 DIAGNOSIS — M545 Low back pain, unspecified: Secondary | ICD-10-CM

## 2021-06-11 DIAGNOSIS — G8929 Other chronic pain: Secondary | ICD-10-CM

## 2021-06-11 NOTE — Therapy (Signed)
[facility] [address] [place], [place], [address] Phone: [phone]   Fax:  [phone]  Physical Therapy Treatment  Patient Details  Name: Darlene [name] MRN: [id number] Date of Birth: [birth year] Referring Provider (PT): Dr. Sharolyn Douglas   Encounter Date: 06/11/2021   PT End of Session - 06/11/21 0956     Visit Number 11    Number of Visits 14    Date for PT Re-Evaluation 07/02/21    Authorization Type Generic [employer], "visits in excess of 30 visits will be reviewed for continued medical necessity"    Progress Note Due on Visit 20    PT Start Time 0927    PT Stop Time 1005    PT Time Calculation (min) 38 min    Activity Tolerance Patient tolerated treatment well;No increased pain    Behavior During Therapy WFL for tasks assessed/performed             Past Medical History:  Diagnosis Date   Anemia    Arthritis    Asthma    Activity induced   Essential hypertension, benign    Family history of adverse reaction to anesthesia    Mom and daughter had difficulty waking up post surgery.  Mother's body temperature dropped during and after surgery.    Headache    History of vertigo    Impaired fasting glucose    Thyroid nodule     Past Surgical History:  Procedure Laterality Date   ABDOMINAL HYSTERECTOMY     CHOLECYSTECTOMY OPEN     IR ANGIO INTRA EXTRACRAN SEL INTERNAL CAROTID BILAT MOD SED  10/16/2019   IR ANGIO INTRA EXTRACRAN SEL INTERNAL CAROTID UNI R MOD SED  11/27/2019   IR ANGIO INTRA EXTRACRAN SEL INTERNAL CAROTID UNI R MOD SED  05/22/2020   IR ANGIO VERTEBRAL SEL VERTEBRAL BILAT MOD SED  10/16/2019   IR ANGIOGRAM FOLLOW UP STUDY  11/27/2019   IR TRANSCATH/EMBOLIZ  11/27/2019   Left hand surgery     PLACEMENT OF BREAST IMPLANTS     RADIOLOGY WITH ANESTHESIA N/A 11/27/2019   Procedure: Surpass embolization of right carotid aneurysm;  Surgeon: Lisbeth Renshaw, MD;  Location: [facility] OR;  Service: Radiology;  Laterality: N/A;     There were no vitals filed for this visit.   Subjective Assessment - 06/11/21 0929     Subjective Pt states she is doing better today; pain is down.  She is doing her exercises at home without complaint.    Limitations Sitting;Lifting;Standing;House hold activities    How long can you sit comfortably? 10 minutes    How long can you stand comfortably? 10 minutes    How long can you walk comfortably? "I can walk more than anything."    Diagnostic tests x-ray and MRI: reveals OA, bone spurs, DDD    Currently in Pain? Yes    Pain Score 2     Pain Location Back    Pain Orientation Lower    Pain Descriptors / Indicators Aching    Pain Type Chronic pain    Pain Radiating Towards buttock and hips    Pain Onset More than a month ago    Pain Frequency Constant    Aggravating Factors  bending ; lifting and carrying    Pain Relieving Factors SI Belt  [facility] Adult PT Treatment/Exercise - 06/11/21 0001       Balance Poses: Yoga   Tree Pose 30 seconds;1 rep      Lumbar Exercises: Stretches   Prone on Elbows Stretch 1 rep;60 seconds    Press Ups 10 reps    Other Lumbar Stretch Exercise --      Lumbar Exercises: Standing   Lifting Limitations lifting orange weight ball from floorto overhead   height, cueing for mechanics    Wall Slides 20 reps;2 seconds    Scapular Retraction Strengthening;Both;10 reps    Row Strengthening;Both    Shoulder Extension Strengthening;Both    Other Standing Lumbar Exercises paloff press 2x10 with green t-band      Lumbar Exercises: Supine   Bent Knee Raise 5 reps    Dead Bug 10 reps;5 seconds    Dead Bug Limitations on foam rollar      Lumbar Exercises: Prone   Other Prone Lumbar Exercises plank into downward dog x 10      Lumbar Exercises: Quadruped   Madcat/Old Horse Limitations 10" holds in each position- verbal and tactile cues - 3 minutes total    Opposite Arm/Leg Raise Right arm/Left leg;Left  arm/Right leg;5 seconds;10 reps                      PT Short Term Goals - 06/04/21 0932       PT SHORT TERM GOAL #1   Title Patient will be independent with HEP in order to improve functional outcomes.    Baseline does exercises every other day    Time 3    Period Weeks    Status On-going    Target Date 05/27/21      PT SHORT TERM GOAL #2   Title Patient will report at least 25% improvement in symptoms for improved quality of life.    Baseline 30-60% better depends on day/activities    Time 3    Period Weeks    Status Achieved    Target Date 05/27/21      PT SHORT TERM GOAL #3   Title Demo proper squat lift form to improve body mechanics to reduce pain during gardeneing activities    Baseline poor squat form    Time 3    Period Weeks    Status On-going    Target Date 05/27/21               PT Long Term Goals - 06/04/21 0932       PT LONG TERM GOAL #1   Title Demo trunk strength of 4/5 to improve lumbar stability when lifting    Time 6    Period Weeks    Status On-going      PT LONG TERM GOAL #2   Title Patient will improve FOTO score by at least 5 points in order to indicate improved tolerance to activity.    Baseline was 40% now 50%    Time 6    Period Weeks    Status Achieved      PT LONG TERM GOAL #3   Title Patient will report participating in yoga at least 1x/week to improve overall flexibility    Time 4    Period Weeks    Status New    Target Date 07/02/21                   Plan - 06/11/21 0956     Clinical Impression Statement Added  foam rollar to pt program with noted difficulty but able to stabilize with verbal cuing.  Pt needs cuing for proper technique on opposite arm/leg raise as well.  Added wall squats to increase LE strength due to noted leg weakness coming up from squatted postition with lifting.    Personal Factors and Comorbidities Age;Time since onset of injury/illness/exacerbation;Past/Current Experience     Examination-Activity Limitations Carry;Lift;Stand;Stairs;Squat;Locomotion Level    Examination-Participation Restrictions Cleaning;Community Activity;Yard Work;Occupation    Stability/Clinical Decision Making Stable/Uncomplicated    Rehab Potential Good    PT Frequency 1x / week    PT Duration 4 weeks    PT Treatment/Interventions ADLs/Self Care Home Management;Aquatic Therapy;Electrical Stimulation;DME Instruction;Traction;Moist Heat;Gait training;Stair training;Functional mobility training;Therapeutic activities;Therapeutic exercise;Balance training;Patient/family education;Neuromuscular re-education;Manual techniques;Spinal Manipulations;Joint Manipulations    PT Next Visit Plan Continue with core strengthening, body mechanics for lifting from ground, farmer's carry,    PT Home Exercise Plan [facility], [facility], PPT 6/23 hip abd/ add isometric, bridge; prone TKE, DKC ball, LTR ball, stiff-arm pull down, high-row, paloff press 7/7 POE: 7/12: quadruped UE/LE, child's pose; 7/14 self traction    Consulted and Agree with Plan of Care Patient             Patient will benefit from skilled therapeutic intervention in order to improve the following deficits and impairments:  Decreased activity tolerance, Decreased strength, Decreased range of motion, Improper body mechanics, Pain  Visit Diagnosis: Muscle weakness (generalized)  Chronic low back pain, unspecified back pain laterality, unspecified whether sciatica present     Problem List Patient Active Problem List   Diagnosis Date Noted   Endocrine function study abnormality 09/09/2020   Hypercalcemia 08/11/2020   Cerebral aneurysm, nonruptured 11/27/2019   Hypertensive urgency 09/14/2019   Hypokalemia    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 [address] [place], [place], [address] Phone: [phone]   Fax:  [phone]  Name: Darlene [name] MRN: [id number] Date of Birth: [birth year]

## 2021-06-16 ENCOUNTER — Encounter (HOSPITAL_COMMUNITY): Payer: 59

## 2021-06-18 ENCOUNTER — Ambulatory Visit (HOSPITAL_COMMUNITY): Payer: 59 | Attending: Orthopaedic Surgery | Admitting: Physical Therapy

## 2021-06-18 ENCOUNTER — Other Ambulatory Visit: Payer: Self-pay

## 2021-06-18 ENCOUNTER — Encounter (HOSPITAL_COMMUNITY): Payer: Self-pay | Admitting: Physical Therapy

## 2021-06-18 DIAGNOSIS — G8929 Other chronic pain: Secondary | ICD-10-CM | POA: Diagnosis present

## 2021-06-18 DIAGNOSIS — M6281 Muscle weakness (generalized): Secondary | ICD-10-CM | POA: Diagnosis present

## 2021-06-18 DIAGNOSIS — M545 Low back pain, unspecified: Secondary | ICD-10-CM | POA: Insufficient documentation

## 2021-06-18 NOTE — Therapy (Signed)
[facility] [address] [place], [place], [address] Phone: [phone]   Fax:  [phone]  Physical Therapy Treatment  Patient Details  Name: Darlene [name] MRN: [id number] Date of Birth: 12-19-1960 Referring Provider (PT): Dr. Sharolyn Douglas   Encounter Date: 06/18/2021   PT End of Session - 06/18/21 0918     Visit Number 12    Number of Visits 14    Date for PT Re-Evaluation 07/02/21    Authorization Type Generic [employer], "visits in excess of 30 visits will be reviewed for continued medical necessity"    Progress Note Due on Visit 20    PT Start Time 0917    PT Stop Time 0955    PT Time Calculation (min) 38 min    Activity Tolerance Patient tolerated treatment well;No increased pain    Behavior During Therapy WFL for tasks assessed/performed             Past Medical History:  Diagnosis Date   Anemia    Arthritis    Asthma    Activity induced   Essential hypertension, benign    Family history of adverse reaction to anesthesia    Mom and daughter had difficulty waking up post surgery.  Mother's body temperature dropped during and after surgery.    Headache    History of vertigo    Impaired fasting glucose    Thyroid nodule     Past Surgical History:  Procedure Laterality Date   ABDOMINAL HYSTERECTOMY     CHOLECYSTECTOMY OPEN     IR ANGIO INTRA EXTRACRAN SEL INTERNAL CAROTID BILAT MOD SED  10/16/2019   IR ANGIO INTRA EXTRACRAN SEL INTERNAL CAROTID UNI R MOD SED  11/27/2019   IR ANGIO INTRA EXTRACRAN SEL INTERNAL CAROTID UNI R MOD SED  05/22/2020   IR ANGIO VERTEBRAL SEL VERTEBRAL BILAT MOD SED  10/16/2019   IR ANGIOGRAM FOLLOW UP STUDY  11/27/2019   IR TRANSCATH/EMBOLIZ  11/27/2019   Left hand surgery     PLACEMENT OF BREAST IMPLANTS     RADIOLOGY WITH ANESTHESIA N/A 11/27/2019   Procedure: Surpass embolization of right carotid aneurysm;  Surgeon: Lisbeth Renshaw, MD;  Location: [facility] OR;  Service: Radiology;  Laterality: N/A;     There were no vitals filed for this visit.   Subjective Assessment - 06/18/21 0923     Subjective States that she is having some pain today on the right side of her back and down the back of her left leg. States that she had this pain yesterday. States that she has been very busy and has not done her exercises.    Limitations Sitting;Lifting;Standing;House hold activities    How long can you sit comfortably? 10 minutes    How long can you stand comfortably? 10 minutes    How long can you walk comfortably? "I can walk more than anything."    Diagnostic tests x-ray and MRI: reveals OA, bone spurs, DDD    Currently in Pain? Yes    Pain Score 5     Pain Location Back    Pain Orientation Right;Left    Pain Descriptors / Indicators Aching    Pain Onset More than a month ago                [facility] PT Assessment - 06/18/21 0001       Assessment   Medical Diagnosis M47.896 Spondylosis    Referring Provider (PT) Dr. Sharolyn Douglas  [facility] Adult PT Treatment/Exercise - 06/18/21 0001       Lumbar Exercises: Stretches   Prone on Elbows Stretch --   5 minutes total   Other Lumbar Stretch Exercise piriformis/nerve glide - 2 minutes in/out of position - R - symptoms resolved    Other Lumbar Stretch Exercise sciatic nerve glide supine 2 minutes      Lumbar Exercises: Standing   Other Standing Lumbar Exercises lateral foot taps in mini hip hinge position 3x10 bilateral      Lumbar Exercises: Seated   Sit to Stand --   4x6 with slow lower and yellow weighted ball - cues to keep toes on ground (rises up)     Lumbar Exercises: Prone   Straight Leg Raise 20 reps;5 seconds   with core activation, bilateral                   PT Education - 06/18/21 0922     Education Details educated patient on integrating her exercises into her routine.    Person(s) Educated Patient    Methods Explanation    Comprehension Verbalized understanding               PT Short Term Goals - 06/04/21 0932       PT SHORT TERM GOAL #1   Title Patient will be independent with HEP in order to improve functional outcomes.    Baseline does exercises every other day    Time 3    Period Weeks    Status On-going    Target Date 05/27/21      PT SHORT TERM GOAL #2   Title Patient will report at least 25% improvement in symptoms for improved quality of life.    Baseline 30-60% better depends on day/activities    Time 3    Period Weeks    Status Achieved    Target Date 05/27/21      PT SHORT TERM GOAL #3   Title Demo proper squat lift form to improve body mechanics to reduce pain during gardeneing activities    Baseline poor squat form    Time 3    Period Weeks    Status On-going    Target Date 05/27/21               PT Long Term Goals - 06/04/21 0932       PT LONG TERM GOAL #1   Title Demo trunk strength of 4/5 to improve lumbar stability when lifting    Time 6    Period Weeks    Status On-going      PT LONG TERM GOAL #2   Title Patient will improve FOTO score by at least 5 points in order to indicate improved tolerance to activity.    Baseline was 40% now 50%    Time 6    Period Weeks    Status Achieved      PT LONG TERM GOAL #3   Title Patient will report participating in yoga at least 1x/week to improve overall flexibility    Time 4    Period Weeks    Status New    Target Date 07/02/21                   Plan - 06/18/21 3545     Clinical Impression Statement Patient tolerated session well.  Educated patient importance of integrating exercise sin daily routine to help with pain. Patient verbalized understanding. Symptoms resolved with stretches and  able to transition back to strengthening exercises. No pain noted end of session just fatigue. Form improved with squats/sit to stands with practice.    Personal Factors and Comorbidities Age;Time since onset of injury/illness/exacerbation;Past/Current  Experience    Examination-Activity Limitations Carry;Lift;Stand;Stairs;Squat;Locomotion Level    Examination-Participation Restrictions Cleaning;Community Activity;Yard Work;Occupation    Stability/Clinical Decision Making Stable/Uncomplicated    Rehab Potential Good    PT Frequency 1x / week    PT Duration 4 weeks    PT Treatment/Interventions ADLs/Self Care Home Management;Aquatic Therapy;Electrical Stimulation;DME Instruction;Traction;Moist Heat;Gait training;Stair training;Functional mobility training;Therapeutic activities;Therapeutic exercise;Balance training;Patient/family education;Neuromuscular re-education;Manual techniques;Spinal Manipulations;Joint Manipulations    PT Next Visit Plan Continue with core strengthening, body mechanics for lifting from ground, farmer's carry,    PT Home Exercise Plan [facility], [facility], PPT 6/23 hip abd/ add isometric, bridge; prone TKE, DKC ball, LTR ball, stiff-arm pull down, high-row, paloff press 7/7 POE: 7/12: quadruped UE/LE, child's pose; 7/14 self traction    Consulted and Agree with Plan of Care Patient             Patient will benefit from skilled therapeutic intervention in order to improve the following deficits and impairments:  Decreased activity tolerance, Decreased strength, Decreased range of motion, Improper body mechanics, Pain  Visit Diagnosis: Muscle weakness (generalized)  Chronic low back pain, unspecified back pain laterality, unspecified whether sciatica present     Problem List Patient Active Problem List   Diagnosis Date Noted   Endocrine function study abnormality 09/09/2020   Hypercalcemia 08/11/2020   Cerebral aneurysm, nonruptured 11/27/2019   Hypertensive urgency 09/14/2019   Hypokalemia    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 [place], [place], [address] Phone: [phone]   Fax:  [phone]  Name: Darlene [name] MRN: [id number] Date of Birth: 27-Mar-1961

## 2021-06-25 ENCOUNTER — Other Ambulatory Visit: Payer: Self-pay

## 2021-06-25 ENCOUNTER — Ambulatory Visit (HOSPITAL_COMMUNITY): Payer: 59

## 2021-06-25 DIAGNOSIS — G8929 Other chronic pain: Secondary | ICD-10-CM

## 2021-06-25 DIAGNOSIS — M6281 Muscle weakness (generalized): Secondary | ICD-10-CM | POA: Diagnosis not present

## 2021-06-25 DIAGNOSIS — M545 Low back pain, unspecified: Secondary | ICD-10-CM

## 2021-06-25 NOTE — Therapy (Signed)
[facility] [address] [place], [place], [address] Phone: [phone]   Fax:  [phone]  Physical Therapy Treatment  Patient Details  Name: DAMIYAH [name] MRN: [id number] Date of Birth: 02/24/1961 Referring Provider (PT): Dr. Sharolyn Douglas   Encounter Date: 06/25/2021   PT End of Session - 06/25/21 0900     Visit Number 13    Number of Visits 14    Date for PT Re-Evaluation 07/02/21    Authorization Type Generic [employer], "visits in excess of 30 visits will be reviewed for continued medical necessity"    Progress Note Due on Visit 20    PT Start Time 0900    PT Stop Time 0945    PT Time Calculation (min) 45 min    Activity Tolerance Patient tolerated treatment well;No increased pain    Behavior During Therapy WFL for tasks assessed/performed             Past Medical History:  Diagnosis Date   Anemia    Arthritis    Asthma    Activity induced   Essential hypertension, benign    Family history of adverse reaction to anesthesia    Mom and daughter had difficulty waking up post surgery.  Mother's body temperature dropped during and after surgery.    Headache    History of vertigo    Impaired fasting glucose    Thyroid nodule     Past Surgical History:  Procedure Laterality Date   ABDOMINAL HYSTERECTOMY     CHOLECYSTECTOMY OPEN     IR ANGIO INTRA EXTRACRAN SEL INTERNAL CAROTID BILAT MOD SED  10/16/2019   IR ANGIO INTRA EXTRACRAN SEL INTERNAL CAROTID UNI R MOD SED  11/27/2019   IR ANGIO INTRA EXTRACRAN SEL INTERNAL CAROTID UNI R MOD SED  05/22/2020   IR ANGIO VERTEBRAL SEL VERTEBRAL BILAT MOD SED  10/16/2019   IR ANGIOGRAM FOLLOW UP STUDY  11/27/2019   IR TRANSCATH/EMBOLIZ  11/27/2019   Left hand surgery     PLACEMENT OF BREAST IMPLANTS     RADIOLOGY WITH ANESTHESIA N/A 11/27/2019   Procedure: Surpass embolization of right carotid aneurysm;  Surgeon: Lisbeth Renshaw, MD;  Location: [facility] OR;  Service: Radiology;  Laterality: N/A;     There were no vitals filed for this visit.   Subjective Assessment - 06/25/21 0903     Subjective Pt notes no raidating RLE pain at present and she has been going to yoga class 2-3 times a week. Pt notes improved RLE symptoms with extension-biased movements    Limitations Sitting;Lifting;Standing;House hold activities    How long can you sit comfortably? 10 minutes    How long can you stand comfortably? 10 minutes    How long can you walk comfortably? "I can walk more than anything."    Diagnostic tests x-ray and MRI: reveals OA, bone spurs, DDD    Pain Onset More than a month ago                               [facility] Adult PT Treatment/Exercise - 06/25/21 0001       Lumbar Exercises: Stretches   Prone on Elbows Stretch Other (comment)   20x5 sec raise   Prone Mid Back Stretch 60 seconds      Lumbar Exercises: Standing   Functional Squats 10 reps    Functional Squats Limitations 8 lbs goblet squat    Lifting From  waist;10 reps;From floor    Lifting Weights (lbs) 8    Lifting Limitations sumo deadlift with dumbell, repeated with pillow to mimic bag of dog food      Lumbar Exercises: Quadruped   Madcat/Old Horse Limitations 10x with breath coordination    Straight Leg Raise 10 reps;3 seconds    Opposite Arm/Leg Raise Right arm/Left leg;Left arm/Right leg;10 reps;3 seconds                      PT Short Term Goals - 06/04/21 0932       PT SHORT TERM GOAL #1   Title Patient will be independent with HEP in order to improve functional outcomes.    Baseline does exercises every other day    Time 3    Period Weeks    Status On-going    Target Date 05/27/21      PT SHORT TERM GOAL #2   Title Patient will report at least 25% improvement in symptoms for improved quality of life.    Baseline 30-60% better depends on day/activities    Time 3    Period Weeks    Status Achieved    Target Date 05/27/21      PT SHORT TERM GOAL #3   Title Demo  proper squat lift form to improve body mechanics to reduce pain during gardeneing activities    Baseline poor squat form    Time 3    Period Weeks    Status On-going    Target Date 05/27/21               PT Long Term Goals - 06/04/21 0932       PT LONG TERM GOAL #1   Title Demo trunk strength of 4/5 to improve lumbar stability when lifting    Time 6    Period Weeks    Status On-going      PT LONG TERM GOAL #2   Title Patient will improve FOTO score by at least 5 points in order to indicate improved tolerance to activity.    Baseline was 40% now 50%    Time 6    Period Weeks    Status Achieved      PT LONG TERM GOAL #3   Title Patient will report participating in yoga at least 1x/week to improve overall flexibility    Time 4    Period Weeks    Status New    Target Date 07/02/21                   Plan - 06/25/21 0949     Clinical Impression Statement Progressing very well with tx sessions with today's focus being functional lifts to improve body mechanics and reduce risk for back injury.  Demonstrating good form with use of chair to provide cue for adequate hip flexion to extension principles and trials in lifting small, weighted objects from ground with difficulty in stabilizing to use deadlift. Continued sessions indicated to progress core strength and functional lift patterns    Personal Factors and Comorbidities Age;Time since onset of injury/illness/exacerbation;Past/Current Experience    Examination-Activity Limitations Carry;Lift;Stand;Stairs;Squat;Locomotion Level    Examination-Participation Restrictions Cleaning;Community Activity;Yard Work;Occupation    Stability/Clinical Decision Making Stable/Uncomplicated    Rehab Potential Good    PT Frequency 1x / week    PT Duration 4 weeks    PT Treatment/Interventions ADLs/Self Care Home Management;Aquatic Therapy;Electrical Stimulation;DME Instruction;Traction;Moist Heat;Gait training;Stair  training;Functional mobility training;Therapeutic activities;Therapeutic exercise;Balance training;Patient/family  education;Neuromuscular re-education;Manual techniques;Spinal Manipulations;Joint Manipulations    PT Next Visit Plan Continue with core strengthening, body mechanics for lifting from ground, farmer's carry,    PT Home Exercise Plan [facility], [facility], PPT 6/23 hip abd/ add isometric, bridge; prone TKE, DKC ball, LTR ball, stiff-arm pull down, high-row, paloff press 7/7 POE: 7/12: quadruped UE/LE, child's pose; 7/14 self traction    Consulted and Agree with Plan of Care Patient             Patient will benefit from skilled therapeutic intervention in order to improve the following deficits and impairments:  Decreased activity tolerance, Decreased strength, Decreased range of motion, Improper body mechanics, Pain  Visit Diagnosis: Muscle weakness (generalized)  Chronic low back pain, unspecified back pain laterality, unspecified whether sciatica present     Problem List Patient Active Problem List   Diagnosis Date Noted   Endocrine function study abnormality 09/09/2020   Hypercalcemia 08/11/2020   Cerebral aneurysm, nonruptured 11/27/2019   Hypertensive urgency 09/14/2019   Hypokalemia    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 [place], [place], [address] Phone: [phone]   Fax:  [phone]  Name: VERDELLA LAIDLAW MRN: [id number] Date of Birth: 08-29-1961

## 2021-07-02 ENCOUNTER — Ambulatory Visit (HOSPITAL_COMMUNITY): Payer: Self-pay | Admitting: Physical Therapy

## 2021-07-10 ENCOUNTER — Telehealth (HOSPITAL_COMMUNITY): Payer: Self-pay | Admitting: Physical Therapy

## 2021-07-10 ENCOUNTER — Ambulatory Visit (HOSPITAL_COMMUNITY): Payer: 59 | Admitting: Physical Therapy

## 2021-07-10 NOTE — Telephone Encounter (Signed)
Patient l/m to cx no reason given

## 2021-07-30 ENCOUNTER — Other Ambulatory Visit (HOSPITAL_COMMUNITY): Payer: Self-pay | Admitting: Family Medicine

## 2021-07-30 DIAGNOSIS — I10 Essential (primary) hypertension: Secondary | ICD-10-CM

## 2021-08-06 ENCOUNTER — Ambulatory Visit (HOSPITAL_COMMUNITY)
Admission: RE | Admit: 2021-08-06 | Discharge: 2021-08-06 | Disposition: A | Discharge status: Home / Self Care | Payer: 59 | Source: Ambulatory Visit | Attending: Family Medicine | Admitting: Family Medicine

## 2021-08-06 ENCOUNTER — Other Ambulatory Visit: Payer: Self-pay

## 2021-08-06 DIAGNOSIS — I10 Essential (primary) hypertension: Secondary | ICD-10-CM | POA: Diagnosis present

## 2021-10-30 ENCOUNTER — Encounter (HOSPITAL_COMMUNITY): Payer: Self-pay

## 2021-10-30 NOTE — Therapy (Signed)
[place], [place], [address] Phone: [phone]   Fax:  [phone]  Patient Details  Name: Darlene [name] MRN: [id number] Date of Birth: 04-19-1961 Referring Provider:  No ref. provider found PHYSICAL THERAPY DISCHARGE SUMMARY  Visits from Start of Care: 13  Current functional level related to goals / functional outcomes: Progressing with POC details    Remaining deficits: Some discomfort in low back   Education / Equipment: HEP   Patient agrees to discharge. Patient goals were partially met. Patient is being discharged due to not returning since the last visit.  Encounter Date: 10/30/2021   Toniann Fail, PT 10/30/2021, 12:03 PM  [place] [address] [place], [place], [address] Phone: [phone]   Fax:  [phone]

## 2022-10-06 ENCOUNTER — Encounter: Payer: Self-pay | Admitting: *Deleted

## 2022-10-06 ENCOUNTER — Other Ambulatory Visit (HOSPITAL_COMMUNITY): Payer: Self-pay | Admitting: Nurse Practitioner

## 2022-10-06 DIAGNOSIS — K529 Noninfective gastroenteritis and colitis, unspecified: Secondary | ICD-10-CM

## 2022-10-15 ENCOUNTER — Ambulatory Visit (HOSPITAL_COMMUNITY)
Admission: RE | Admit: 2022-10-15 | Discharge: 2022-10-15 | Disposition: A | Discharge status: Home / Self Care | Payer: 59 | Source: Ambulatory Visit | Attending: Nurse Practitioner | Admitting: Nurse Practitioner

## 2022-10-15 DIAGNOSIS — K529 Noninfective gastroenteritis and colitis, unspecified: Secondary | ICD-10-CM | POA: Diagnosis present

## 2022-11-18 ENCOUNTER — Ambulatory Visit (INDEPENDENT_AMBULATORY_CARE_PROVIDER_SITE_OTHER): Payer: 59 | Admitting: Gastroenterology

## 2022-11-18 ENCOUNTER — Encounter: Payer: Self-pay | Admitting: Gastroenterology

## 2022-11-18 VITALS — BP 144/75 | HR 89 | Temp 97.2°F | Ht 63.0 in | Wt 112.2 lb

## 2022-11-18 DIAGNOSIS — R1013 Epigastric pain: Secondary | ICD-10-CM

## 2022-11-18 DIAGNOSIS — Z1211 Encounter for screening for malignant neoplasm of colon: Secondary | ICD-10-CM

## 2022-11-18 DIAGNOSIS — R1310 Dysphagia, unspecified: Secondary | ICD-10-CM | POA: Diagnosis not present

## 2022-11-18 NOTE — Progress Notes (Signed)
Gastroenterology Office Note    Referring Provider: Celene Squibb, MD/Brandi Kenton Kingfisher, FNP-C Primary Care Physician:  Celene Squibb, MD  Primary GI: Dr. Gala Romney    Chief Complaint   Chief Complaint  Patient presents with   New Patient (Initial Visit)    Pt has been having nausea, diarrhea and abd pain (mid upper pain radiating through the back)     History of Present Illness   Darlene [name] is a 62 y.o. female presenting today at the request of Perfecto Kingdom, FNP-C/Dr. Wende Neighbors for abdominal pain, diarrhea, and nausea. No prior colonoscopy. Last seen in 2003 with EGD at that time.     Historically has dealt with constipation. For past 4 months, has had diarrhea. Was having diarrhea prior to this week, now skipping a few days and then having diarrhea.   Has epigastric pain, shooting through to her back for over a year. Intermittent. Can occur postprandially. Sometimes with just water. Nausea is intermittent. Nausea for past year. No typical GERD symptoms. Notes solid food dysphagia chronically. Feels like can't swallow.   Notes weight loss from 117 down to 104, then started gaining weight back again. Now fluctuating between 105-110. No melena or hematochezia. Rare Ibuprofen. Tries to stay with tylenol.    FH: no colon cancer. No FH colon polyps.   No prior colonoscopy.    CT abd/pelvis without contrast Oct 15, 2022: large amount of stool in descending and rectosigmoid colon.   Outside labs from Nov 2023 at [place]: Hgb 12.4, Hct 37.1, MCV elevated at 103, platelets 366, Tbili 0.3, Alk Phos 97, AST 28, ALT 18, lipase 13,   Past Medical History:  Diagnosis Date   Anemia    Arthritis    Asthma    Activity induced   Essential hypertension, benign    Family history of adverse reaction to anesthesia    Mom and daughter had difficulty waking up post surgery.  Mother's body temperature dropped during and after surgery.    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 Gala Romney: normal esohpagus, antral and bulbar erosions   IR ANGIO INTRA EXTRACRAN SEL INTERNAL CAROTID BILAT MOD SED  10/16/2019   IR ANGIO INTRA EXTRACRAN SEL INTERNAL CAROTID UNI R MOD SED  11/27/2019   IR ANGIO INTRA EXTRACRAN SEL INTERNAL CAROTID UNI R MOD SED  05/22/2020   IR ANGIO VERTEBRAL SEL VERTEBRAL BILAT MOD SED  10/16/2019   IR ANGIOGRAM FOLLOW UP STUDY  11/27/2019   IR TRANSCATH/EMBOLIZ  11/27/2019   Left hand surgery     PLACEMENT OF BREAST IMPLANTS     RADIOLOGY WITH ANESTHESIA N/A 11/27/2019   Procedure: Surpass embolization of right carotid aneurysm;  Surgeon: Consuella Lose, MD;  Location: [place];  Service: Radiology;  Laterality: N/A;    Current Outpatient Medications  Medication Sig Dispense Refill   acetaminophen (TYLENOL) 500 MG tablet Take 500 mg by mouth every 6 (six) hours as needed (pain.). For pain      amLODipine (NORVASC) 2.5 MG tablet Take 10 mg by mouth daily.     Ascorbic Acid (VITAMIN C) 1000 MG tablet Take 5,000 mg by mouth daily.      diphenhydrAMINE (BENADRYL)  25 mg capsule Take 25 mg by mouth 2 (two) times daily as needed. For allergies      HYDROcodone-acetaminophen (NORCO) 10-325 MG tablet Take 1 tablet by mouth every 6 (six) hours as needed for pain.     ibuprofen (ADVIL,MOTRIN) 200 MG tablet Take 400 mg by mouth every 8 (eight) hours as needed.      omeprazole (PRILOSEC) 20 MG capsule Take 20 mg by mouth daily.     oxycodone (OXY-IR) 5 MG capsule Take 5 mg by mouth every 4 (four) hours as needed.     No current facility-administered medications for this visit.    Allergies as of 11/18/2022 - Review Complete 11/18/2022  Allergen Reaction Noted   Chlorthalidone  07/24/2013   Prednisone  07/24/2013   Bee venom Swelling and Rash  09/06/2011    Family History  Problem Relation Age of Onset   Cancer Mother    Hypertension Father    Cancer Father    Heart attack Father    Stroke Father    Colon cancer Neg Hx    Colon polyps Neg Hx     Social History   Socioeconomic History   Marital status: Married    Spouse name: Not on file   Number of children: Not on file   Years of education: Not on file   Highest education level: Not on file  Occupational History   Not on file  Tobacco Use   Smoking status: Every Day    Packs/day: 1.00    Years: 35.00    Total pack years: 35.00    Types: Cigarettes   Smokeless tobacco: Never  Vaping Use   Vaping Use: Never used  Substance and Sexual Activity   Alcohol use: No   Drug use: Yes    Types: Hydromorphone   Sexual activity: Yes  Other Topics Concern   Not on file  Social History Narrative   Not on file   Social Determinants of Health   Financial Resource Strain: Not on file  Food Insecurity: Not on file  Transportation Needs: Not on file  Physical Activity: Not on file  Stress: Not on file  Social Connections: Not on file  Intimate Partner Violence: Not on file     Review of Systems   Gen: Denies any fever, chills, fatigue, weight loss, lack of appetite.  CV: Denies chest pain, heart palpitations, peripheral edema, syncope.  Resp: Denies shortness of breath at rest or with exertion. Denies wheezing or cough.  GI: see HPI GU : Denies urinary burning, urinary frequency, urinary hesitancy MS: Denies joint pain, muscle weakness, cramps, or limitation of movement.  Derm: Denies rash, itching, dry skin Psych: Denies depression, anxiety, memory loss, and confusion Heme: Denies bruising, bleeding, and enlarged lymph nodes.   Physical Exam   BP (!) 144/75   Pulse 89   Temp (!) 97.2 F (36.2 C)   Ht 5\' 3"  (1.6 m)   Wt 112 lb 3.2 oz (50.9 kg)   BMI 19.88 kg/m  General:   Alert and oriented. Pleasant and cooperative. Well-nourished and  well-developed.  Head:  Normocephalic and atraumatic. Eyes:  Without icterus Ears:  Normal auditory acuity. Lungs:  Clear to auscultation bilaterally.  Heart:  S1, S2 present without murmurs appreciated.  Abdomen:  +BS, soft, non-tender and non-distended. No HSM noted. No guarding or rebound. No masses appreciated.  Rectal:  Deferred  Msk:  Symmetrical without gross deformities. Normal posture. Extremities:  Without edema. Neurologic:  Alert and  oriented x4;  grossly normal neurologically. Skin:  Intact without significant lesions or rashes. Psych:  Alert and cooperative. Normal mood and affect.  CT abd/pelvis without contrast Oct 15, 2022: large amount of stool in descending and rectosigmoid colon.   Outside labs from Nov 2023 at [place]: Hgb 12.4, Hct 37.1, MCV elevated at 103, platelets 366, Tbili 0.3, Alk Phos 97, AST 28, ALT 18, lipase 13,    Assessment   Darlene [name] is a 62 y.o. female presenting today at the request of Perfecto Kingdom, FNP-C/Dr. Wende Neighbors for abdominal pain, diarrhea, and nausea. No prior colonoscopy. Last seen in 2003 with EGD at that time.   Diarrhea: patient actually has moreso of an overflow diarrhea presentation. CT without contrast in December with large amount of stool. She will skip multiple days then have diarrhea. We will do a purge with Miralax then start Linzess 145 mcg daily. Needs initial screening colonoscopy in future.  Abdominal pain: epigastric pain with nausea. No typical GERD symptoms. Also noting solid food dysphagia chronically. CT without contrast unrevealing other than stool burden. Outside labs from Nov 2023 unrevealing with normal LFTs and lipase. Gallbladder absent. Will pursue EGD with dilation in the near future.      PLAN   Continue omeprazole daily  Miralax purge today, start Linzess 145 mcg tomorrow, call if no improvement  Pursuing colonoscopy/EGD/dilation with Dr. Gala Romney in future. Risks and benefits discussed with stated  understanding. ASA 3.   Patient to call if no improvement in bowel habits, as we discussed we do not want to pursue colonoscopy if not adequate bowel regimen.      Annitta Needs, PhD, ANP-BC [facility]

## 2022-11-18 NOTE — Patient Instructions (Signed)
I feel you have underlying constipation and experiencing what is called "overflow" diarrhea.   Today: take 1 capful of Miralax in 8 ounces of water every hour up to 6 doses. This is to get your bowels moving and clean out, almost like you would for a colonoscopy prep.  Tomorrow, start taking Linzess 1 capsule, 30 minutes before breakfast. Linzess works best when taken once a day every day, on an empty stomach, at least 30 minutes before your first meal of the day.  When Linzess is taken daily as directed:  *Constipation relief is typically felt in about a week *IBS-C patients may begin to experience relief from belly pain and overall abdominal symptoms (pain, discomfort, and bloating) in about 1 week,   with symptoms typically improving over 12 weeks.  Diarrhea may occur in the first 2 weeks -keep taking it.  The diarrhea should go away and you should start having normal, complete, full bowel movements. It may be helpful to start treatment when you can be near the comfort of your own bathroom, such as a weekend.    Continue omeprazole daily.   We are arranging a colonoscopy, upper endoscopy, and dilation in the near future!  Please let us know if bowel habits do not improve as we want to make sure you are cleaned out prior to the colonoscopy!   It was a pleasure to see you today. I want to create trusting relationships with patients to provide genuine, compassionate, and quality care. I value your feedback. If you receive a survey regarding your visit,  I greatly appreciate you taking time to fill this out.   Annitta Needs, PhD, ANP-BC [facility]astroenter[facility]

## 2022-11-30 ENCOUNTER — Telehealth: Payer: Self-pay | Admitting: *Deleted

## 2022-11-30 NOTE — Telephone Encounter (Signed)
[facility] -need to schedule TCS/EGD/dil ASA 3 with Dr.Rourk

## 2022-12-08 ENCOUNTER — Encounter: Payer: Self-pay | Admitting: Gastroenterology

## 2022-12-08 NOTE — Telephone Encounter (Signed)
Pt left VM returning call. Called back, LMTCB

## 2022-12-10 ENCOUNTER — Encounter: Payer: Self-pay | Admitting: *Deleted

## 2022-12-10 ENCOUNTER — Other Ambulatory Visit: Payer: Self-pay | Admitting: *Deleted

## 2022-12-10 MED ORDER — PEG 3350-KCL-NA BICARB-NACL 420 G PO SOLR: 4000.0000 mL | Freq: Once | ORAL | 0 refills | Status: AC

## 2022-12-10 NOTE — Telephone Encounter (Signed)
Pt has been scheduled for 01/12/23. Instructions mailed and prep sent to the pharmacy.  [facility] and per [employer] - No prior authorization is needed for procedures.

## 2022-12-13 ENCOUNTER — Encounter: Payer: Self-pay | Admitting: *Deleted

## 2023-01-07 NOTE — Patient Instructions (Signed)
Darlene [name]  01/07/2023     '@PREFPERIOPPHARMACY'$ @   Your procedure is scheduled on  01/12/2023.   Report to [facility] at  [facility]   Call this number if you have problems the morning of surgery:  [phone]  If you experience any cold or flu symptoms such as cough, fever, chills, shortness of breath, etc. between now and your scheduled surgery, please notify us at the above number.   Remember:  Follow the diet and prep instructions given to you by the office.     Take these medicines the morning of surgery with A SIP OF WATER       amlodipine, hydrocodone(if needed), omeprazole.     Do not wear jewelry, make-up or nail polish.  Do not wear lotions, powders, or perfumes, or deodorant.  Do not shave 48 hours prior to surgery.  Men may shave face and neck.  Do not bring valuables to the hospital.  [facility] is not responsible for any belongings or valuables.  Contacts, dentures or bridgework may not be worn into surgery.  Leave your suitcase in the car.  After surgery it may be brought to your room.  For patients admitted to the hospital, discharge time will be determined by your treatment team.  Patients discharged the day of surgery will not be allowed to drive home and must have someone with them for 24 hours.    Special instructions:   DO NOT smoke tobacco or vape for 24 hours before your procedure.  Please read over the following fact sheets that you were given. Anesthesia Post-op Instructions and Care and Recovery After Surgery      Upper Endoscopy, Adult, Care After After the procedure, it is common to have a sore throat. It is also common to have: Mild stomach pain or discomfort. Bloating. Nausea. Follow these instructions at home: The instructions below may help you care for yourself at home. Your health care provider may give you more instructions. If you have questions, ask your health care provider. If you were given a sedative  during the procedure, it can affect you for several hours. Do not drive or operate machinery until your health care provider says that it is safe. If you will be going home right after the procedure, plan to have a responsible adult: Take you home from the hospital or clinic. You will not be allowed to drive. Care for you for the time you are told. Follow instructions from your health care provider about what you may eat and drink. Return to your normal activities as told by your health care provider. Ask your health care provider what activities are safe for you. Take over-the-counter and prescription medicines only as told by your health care provider. Contact a health care provider if you: Have a sore throat that lasts longer than one day. Have trouble swallowing. Have a fever. Get help right away if you: Vomit blood or your vomit looks like coffee grounds. Have bloody, black, or tarry stools. Have a very bad sore throat or you cannot swallow. Have difficulty breathing or very bad pain in your chest or abdomen. These symptoms may be an emergency. Get help right away. Call 911. Do not wait to see if the symptoms will go away. Do not drive yourself to the hospital. Summary After the procedure, it is common to have a sore throat, mild stomach discomfort, bloating, and nausea. If you were  given a sedative during the procedure, it can affect you for several hours. Do not drive until your health care provider says that it is safe. Follow instructions from your health care provider about what you may eat and drink. Return to your normal activities as told by your health care provider. This information is not intended to replace advice given to you by your health care provider. Make sure you discuss any questions you have with your health care provider. Document Revised: 02/10/2022 Document Reviewed: 02/10/2022 Elsevier Patient Education  [place]. Esophageal Dilatation Esophageal  dilatation, also called esophageal dilation, is a procedure to widen or open a blocked or narrowed part of the esophagus. The esophagus is the part of the body that moves food and liquid from the mouth to the stomach. You may need this procedure if: You have a buildup of scar tissue in your esophagus that makes it difficult, painful, or impossible to swallow. This can be caused by gastroesophageal reflux disease (GERD). You have cancer of the esophagus. There is a problem with how food moves through your esophagus. In some cases, you may need this procedure repeated at a later time to dilate the esophagus gradually. Tell a health care provider about: Any allergies you have. All medicines you are taking, including vitamins, herbs, eye drops, creams, and over-the-counter medicines. Any problems you or family members have had with anesthetic medicines. Any blood disorders you have. Any surgeries you have had. Any medical conditions you have. Any antibiotic medicines you are required to take before dental procedures. Whether you are pregnant or may be pregnant. What are the risks? Generally, this is a safe procedure. However, problems may occur, including: Bleeding due to a tear in the lining of the esophagus. A hole, or perforation, in the esophagus. What happens before the procedure? Ask your health care provider about: Changing or stopping your regular medicines. This is especially important if you are taking diabetes medicines or blood thinners. Taking medicines such as aspirin and ibuprofen. These medicines can thin your blood. Do not take these medicines unless your health care provider tells you to take them. Taking over-the-counter medicines, vitamins, herbs, and supplements. Follow instructions from your health care provider about eating or drinking restrictions. Plan to have a responsible adult take you home from the hospital or clinic. Plan to have a responsible adult care for you for  the time you are told after you leave the hospital or clinic. This is important. What happens during the procedure? You may be given a medicine to help you relax (sedative). A numbing medicine may be sprayed into the back of your throat, or you may gargle the medicine. Your health care provider may perform the dilatation using various surgical instruments, such as: Simple dilators. This instrument is carefully placed in the esophagus to stretch it. Guided wire bougies. This involves using an endoscope to insert a wire into the esophagus. A dilator is passed over this wire to enlarge the esophagus. Then the wire is removed. Balloon dilators. An endoscope with a small balloon is inserted into the esophagus. The balloon is inflated to stretch the esophagus and open it up. The procedure may vary among health care providers and hospitals. What can I expect after the procedure? Your blood pressure, heart rate, breathing rate, and blood oxygen level will be monitored until you leave the hospital or clinic. Your throat may feel slightly sore and numb. This will get better over time. You will not be allowed to  eat or drink until your throat is no longer numb. When you are able to drink, urinate, and sit on the edge of the bed without nausea or dizziness, you may be able to return home. Follow these instructions at home: Take over-the-counter and prescription medicines only as told by your health care provider. If you were given a sedative during the procedure, it can affect you for several hours. Do not drive or operate machinery until your health care provider says that it is safe. Plan to have a responsible adult care for you for the time you are told. This is important. Follow instructions from your health care provider about any eating or drinking restrictions. Do not use any products that contain nicotine or tobacco, such as cigarettes, e-cigarettes, and chewing tobacco. If you need help quitting, ask  your health care provider. Keep all follow-up visits. This is important. Contact a health care provider if: You have a fever. You have pain that is not relieved by medicine. Get help right away if: You have chest pain. You have trouble breathing. You have trouble swallowing. You vomit blood. You have black, tarry, or bloody stools. These symptoms may represent a serious problem that is an emergency. Do not wait to see if the symptoms will go away. Get medical help right away. Call your local emergency services (911 in the [place] Do not drive yourself to the hospital. Summary Esophageal dilatation, also called esophageal dilation, is a procedure to widen or open a blocked or narrowed part of the esophagus. Plan to have a responsible adult take you home from the hospital or clinic. For this procedure, a numbing medicine may be sprayed into the back of your throat, or you may gargle the medicine. Do not drive or operate machinery until your health care provider says that it is safe. This information is not intended to replace advice given to you by your health care provider. Make sure you discuss any questions you have with your health care provider. Document Revised: 03/19/2020 Document Reviewed: 03/19/2020 Elsevier Patient Education  [place]. Colonoscopy, Adult, Care After The following information offers guidance on how to care for yourself after your procedure. Your health care provider may also give you more specific instructions. If you have problems or questions, contact your health care provider. What can I expect after the procedure? After the procedure, it is common to have: A small amount of blood in your stool for 24 hours after the procedure. Some gas. Mild cramping or bloating of your abdomen. Follow these instructions at home: Eating and drinking  Drink enough fluid to keep your urine pale yellow. Follow instructions from your health care provider about eating or  drinking restrictions. Resume your normal diet as told by your health care provider. Avoid heavy or fried foods that are hard to digest. Activity Rest as told by your health care provider. Avoid sitting for a long time without moving. Get up to take short walks every 1-2 hours. This is important to improve blood flow and breathing. Ask for help if you feel weak or unsteady. Return to your normal activities as told by your health care provider. Ask your health care provider what activities are safe for you. Managing cramping and bloating  Try walking around when you have cramps or feel bloated. If directed, apply heat to your abdomen as told by your health care provider. Use the heat source that your health care provider recommends, such as a moist heat pack or a  heating pad. Place a towel between your skin and the heat source. Leave the heat on for 20-30 minutes. Remove the heat if your skin turns bright red. This is especially important if you are unable to feel pain, heat, or cold. You have a greater risk of getting burned. General instructions If you were given a sedative during the procedure, it can affect you for several hours. Do not drive or operate machinery until your health care provider says that it is safe. For the first 24 hours after the procedure: Do not sign important documents. Do not drink alcohol. Do your regular daily activities at a slower pace than normal. Eat soft foods that are easy to digest. Take over-the-counter and prescription medicines only as told by your health care provider. Keep all follow-up visits. This is important. Contact a health care provider if: You have blood in your stool 2-3 days after the procedure. Get help right away if: You have more than a small spotting of blood in your stool. You have large blood clots in your stool. You have swelling of your abdomen. You have nausea or vomiting. You have a fever. You have increasing pain in your  abdomen that is not relieved with medicine. These symptoms may be an emergency. Get help right away. Call 911. Do not wait to see if the symptoms will go away. Do not drive yourself to the hospital. Summary After the procedure, it is common to have a small amount of blood in your stool. You may also have mild cramping and bloating of your abdomen. If you were given a sedative during the procedure, it can affect you for several hours. Do not drive or operate machinery until your health care provider says that it is safe. Get help right away if you have a lot of blood in your stool, nausea or vomiting, a fever, or increased pain in your abdomen. This information is not intended to replace advice given to you by your health care provider. Make sure you discuss any questions you have with your health care provider. Document Revised: 06/24/2021 Document Reviewed: 06/24/2021 Elsevier Patient Education  [place] After The following information offers guidance on how to care for yourself after your procedure. Your health care provider may also give you more specific instructions. If you have problems or questions, contact your health care provider. What can I expect after the procedure? After the procedure, it is common to have: Tiredness. Little or no memory about what happened during or after the procedure. Impaired judgment when it comes to making decisions. Nausea or vomiting. Some trouble with balance. Follow these instructions at home: For the time period you were told by your health care provider:  Rest. Do not participate in activities where you could fall or become injured. Do not drive or use machinery. Do not drink alcohol. Do not take sleeping pills or medicines that cause drowsiness. Do not make important decisions or sign legal documents. Do not take care of children on your own. Medicines Take over-the-counter and prescription medicines  only as told by your health care provider. If you were prescribed antibiotics, take them as told by your health care provider. Do not stop using the antibiotic even if you start to feel better. Eating and drinking Follow instructions from your health care provider about what you may eat and drink. Drink enough fluid to keep your urine pale yellow. If you vomit: Drink clear fluids slowly and in small amounts  as you are able. Clear fluids include water, ice chips, low-calorie sports drinks, and fruit juice that has water added to it (diluted fruit juice). Eat light and bland foods in small amounts as you are able. These foods include bananas, applesauce, rice, lean meats, toast, and crackers. General instructions  Have a responsible adult stay with you for the time you are told. It is important to have someone help care for you until you are awake and alert. If you have sleep apnea, surgery and some medicines can increase your risk for breathing problems. Follow instructions from your health care provider about wearing your sleep device: When you are sleeping. This includes during daytime naps. While taking prescription pain medicines, sleeping medicines, or medicines that make you drowsy. Do not use any products that contain nicotine or tobacco. These products include cigarettes, chewing tobacco, and vaping devices, such as e-cigarettes. If you need help quitting, ask your health care provider. Contact a health care provider if: You feel nauseous or vomit every time you eat or drink. You feel light-headed. You are still sleepy or having trouble with balance after 24 hours. You get a rash. You have a fever. You have redness or swelling around the IV site. Get help right away if: You have trouble breathing. You have new confusion after you get home. These symptoms may be an emergency. Get help right away. Call 911. Do not wait to see if the symptoms will go away. Do not drive yourself to the  hospital. This information is not intended to replace advice given to you by your health care provider. Make sure you discuss any questions you have with your health care provider. Document Revised: 03/29/2022 Document Reviewed: 03/29/2022 Elsevier Patient Education  [place].

## 2023-01-10 ENCOUNTER — Encounter (HOSPITAL_COMMUNITY)
Admission: RE | Admit: 2023-01-10 | Discharge: 2023-01-10 | Disposition: A | Discharge status: Home / Self Care | Payer: 59 | Source: Ambulatory Visit | Attending: Internal Medicine | Admitting: Internal Medicine

## 2023-01-10 ENCOUNTER — Encounter (HOSPITAL_COMMUNITY): Payer: Self-pay

## 2023-01-10 VITALS — BP 121/63 | HR 80 | Temp 97.8°F | Resp 18 | Ht 63.0 in | Wt 112.2 lb

## 2023-01-10 DIAGNOSIS — I10 Essential (primary) hypertension: Secondary | ICD-10-CM | POA: Diagnosis not present

## 2023-01-10 DIAGNOSIS — Z01810 Encounter for preprocedural cardiovascular examination: Secondary | ICD-10-CM | POA: Insufficient documentation

## 2023-01-12 ENCOUNTER — Telehealth: Payer: Self-pay

## 2023-01-12 ENCOUNTER — Ambulatory Visit (HOSPITAL_COMMUNITY)
Admission: RE | Admit: 2023-01-12 | Discharge: 2023-01-12 | Disposition: A | Discharge status: Home / Self Care | Payer: 59 | Attending: Internal Medicine | Admitting: Internal Medicine

## 2023-01-12 ENCOUNTER — Encounter (HOSPITAL_COMMUNITY)
Admission: RE | Disposition: A | Discharge status: Home / Self Care | Payer: Self-pay | Source: Home / Self Care | Attending: Internal Medicine

## 2023-01-12 ENCOUNTER — Ambulatory Visit (HOSPITAL_COMMUNITY): Payer: 59 | Admitting: Anesthesiology

## 2023-01-12 ENCOUNTER — Ambulatory Visit (HOSPITAL_BASED_OUTPATIENT_CLINIC_OR_DEPARTMENT_OTHER): Payer: 59 | Admitting: Anesthesiology

## 2023-01-12 DIAGNOSIS — R1013 Epigastric pain: Secondary | ICD-10-CM | POA: Insufficient documentation

## 2023-01-12 DIAGNOSIS — Z1211 Encounter for screening for malignant neoplasm of colon: Secondary | ICD-10-CM

## 2023-01-12 DIAGNOSIS — R1310 Dysphagia, unspecified: Secondary | ICD-10-CM | POA: Diagnosis not present

## 2023-01-12 DIAGNOSIS — K52831 Collagenous colitis: Secondary | ICD-10-CM | POA: Diagnosis not present

## 2023-01-12 DIAGNOSIS — K319 Disease of stomach and duodenum, unspecified: Secondary | ICD-10-CM | POA: Diagnosis not present

## 2023-01-12 DIAGNOSIS — K222 Esophageal obstruction: Secondary | ICD-10-CM | POA: Insufficient documentation

## 2023-01-12 DIAGNOSIS — F17210 Nicotine dependence, cigarettes, uncomplicated: Secondary | ICD-10-CM | POA: Diagnosis not present

## 2023-01-12 DIAGNOSIS — K219 Gastro-esophageal reflux disease without esophagitis: Secondary | ICD-10-CM | POA: Diagnosis not present

## 2023-01-12 DIAGNOSIS — J45909 Unspecified asthma, uncomplicated: Secondary | ICD-10-CM | POA: Insufficient documentation

## 2023-01-12 DIAGNOSIS — K449 Diaphragmatic hernia without obstruction or gangrene: Secondary | ICD-10-CM

## 2023-01-12 DIAGNOSIS — K5730 Diverticulosis of large intestine without perforation or abscess without bleeding: Secondary | ICD-10-CM | POA: Insufficient documentation

## 2023-01-12 DIAGNOSIS — I10 Essential (primary) hypertension: Secondary | ICD-10-CM | POA: Diagnosis not present

## 2023-01-12 DIAGNOSIS — K259 Gastric ulcer, unspecified as acute or chronic, without hemorrhage or perforation: Secondary | ICD-10-CM | POA: Diagnosis not present

## 2023-01-12 DIAGNOSIS — Q438 Other specified congenital malformations of intestine: Secondary | ICD-10-CM | POA: Insufficient documentation

## 2023-01-12 HISTORY — PX: MALONEY DILATION: SHX5535

## 2023-01-12 HISTORY — PX: BIOPSY: SHX5522

## 2023-01-12 HISTORY — PX: ESOPHAGOGASTRODUODENOSCOPY (EGD) WITH PROPOFOL: SHX5813

## 2023-01-12 HISTORY — PX: COLONOSCOPY WITH PROPOFOL: SHX5780

## 2023-01-12 SURGERY — COLONOSCOPY WITH PROPOFOL
Anesthesia: General

## 2023-01-12 MED ORDER — LACTATED RINGERS IV SOLN: INTRAVENOUS | Status: DC | PRN

## 2023-01-12 MED ORDER — LIDOCAINE HCL (CARDIAC) PF 100 MG/5ML IV SOSY
PREFILLED_SYRINGE | INTRAVENOUS | Status: DC | PRN
  Administered 2023-01-12: 60 mg via INTRATRACHEAL

## 2023-01-12 MED ORDER — PROPOFOL 500 MG/50ML IV EMUL
INTRAVENOUS | Status: AC
  Filled 2023-01-12: qty 50

## 2023-01-12 MED ORDER — OMEPRAZOLE 40 MG PO CPDR: 40.0000 mg | DELAYED_RELEASE_CAPSULE | Freq: Two times a day (BID) | ORAL | 11 refills | Status: DC

## 2023-01-12 MED ORDER — PROPOFOL 500 MG/50ML IV EMUL
INTRAVENOUS | Status: DC | PRN
  Administered 2023-01-12: 150 ug/kg/min via INTRAVENOUS

## 2023-01-12 MED ORDER — PROPOFOL 1000 MG/100ML IV EMUL
INTRAVENOUS | Status: AC
  Filled 2023-01-12: qty 300

## 2023-01-12 MED ORDER — PROPOFOL 10 MG/ML IV BOLUS
INTRAVENOUS | Status: DC | PRN
  Administered 2023-01-12: 80 mg via INTRAVENOUS
  Administered 2023-01-12: 30 mg via INTRAVENOUS
  Administered 2023-01-12 (×2): 20 mg via INTRAVENOUS
  Administered 2023-01-12: 40 mg via INTRAVENOUS
  Administered 2023-01-12 (×2): 20 mg via INTRAVENOUS

## 2023-01-12 MED ORDER — STERILE WATER FOR IRRIGATION IR SOLN
Status: DC | PRN
  Administered 2023-01-12: .6 mL

## 2023-01-12 MED ORDER — LACTATED RINGERS IV SOLN: INTRAVENOUS | Status: DC

## 2023-01-12 NOTE — Telephone Encounter (Signed)
New rx sent to pharmacy on file

## 2023-01-12 NOTE — Op Note (Signed)
[facility] Patient Name: Darlene [name] Procedure Date: 01/12/2023 8:04 AM MRN: [id number] Date of Birth: [birth year] Attending MD: Norvel Richards , MD, [id number] CSN: [id number] Age: 62 Admit Type: Outpatient Procedure:                Upper GI endoscopy Indications:              Dyspepsia, Dysphagia Providers:                Norvel Richards, MD, Janeece Riggers, RN, Raphael Gibney, Technician Referring MD:              Medicines:                Propofol per Anesthesia Complications:            No immediate complications. Estimated Blood Loss:     Estimated blood loss was minimal. Estimated blood                            loss was minimal. Procedure:                Pre-Anesthesia Assessment:                           - Prior to the procedure, a History and Physical                            was performed, and patient medications and                            allergies were reviewed. The patient's tolerance of                            previous anesthesia was also reviewed. The risks                            and benefits of the procedure and the sedation                            options and risks were discussed with the patient.                            All questions were answered, and informed consent                            was obtained. Prior Anticoagulants: The patient has                            taken no anticoagulant or antiplatelet agents. ASA                            Grade Assessment: III - A patient with severe  systemic disease. After reviewing the risks and                            benefits, the patient was deemed in satisfactory                            condition to undergo the procedure.                           After obtaining informed consent, the endoscope was                            passed under direct vision. Throughout the                            procedure, the patient's blood  pressure, pulse, and                            oxygen saturations were monitored continuously. The                            [id number]-[id number]) scope was introduced through the                            mouth, and advanced to the second part of duodenum.                            The upper GI endoscopy was accomplished without                            difficulty. The patient tolerated the procedure                            well. Scope In: 8:24:17 AM Scope Out: 8:36:05 AM Total Procedure Duration: 0 hours 11 minutes 48 seconds  Findings:      A mild Schatzki ring was found at the gastroesophageal junction. No       esophagitis. No mass. No Barrett's esophagus seen. Stomach empty.      A small hiatal hernia was present. Single 5 mm prepyloric antral ulcer       with clean base. Some surrounding mucosal edema. Please see photos. The       remainder of the gastric mucosa appeared normal. Pylorus patent.      The duodenal bulb and second portion of the duodenum were normal. The       scope was withdrawn. Dilation was performed with a Maloney dilator with       mild resistance at 21 Fr. The scope was withdrawn. Dilation was       performed with a Maloney dilator with mild resistance at 56 Fr. The       dilation site was examined following endoscope reinsertion and showed no       change. Estimated blood loss was minimal. Finally, biopsies of the       prepyloric antral ulcer were taken. Impression:               - Mild  Schatzki ring?"Maloney dilation performed.                           - Small hiatal hernia. Prepyloric antral                            ulcer?"status post biopsy following dilation                           - Normal duodenal bulb and second portion of the                            duodenum.                           - No specimens collected. Moderate Sedation:      Moderate (conscious) sedation was personally administered by an       anesthesia professional. The  following parameters were monitored: oxygen       saturation, heart rate, blood pressure, respiratory rate, EKG, adequacy       of pulmonary ventilation, and response to care. Recommendation:           - Patient has a contact number available for                            emergencies. The signs and symptoms of potential                            delayed complications were discussed with the                            patient. Return to normal activities tomorrow.                            Written discharge instructions were provided to the                            patient.                           - Advance diet as tolerated. Twice daily PPI. Avoid                            NSAIDs. Follow-up on pathology. See colonoscopy                            report.                           - Continue present medications.                           - Return to my office in 3 months. Procedure Code(s):        --- Professional ---                           [phone], Esophagogastroduodenoscopy,  flexible,                            transoral; diagnostic, including collection of                            specimen(s) by brushing or washing, when performed                            (separate procedure)                           43450, Dilation of esophagus, by unguided sound or                            bougie, single or multiple passes Diagnosis Code(s):        --- Professional ---                           K22.2, Esophageal obstruction                           K44.9, Diaphragmatic hernia without obstruction or                            gangrene                           R10.13, Epigastric pain                           R13.10, Dysphagia, unspecified CPT copyright 2022 American Medical Association. All rights reserved. The codes documented in this report are preliminary and upon coder review may  be revised to meet current compliance requirements. Cristopher Estimable. Chae Shuster, MD Norvel Richards,  MD 01/12/2023 9:05:50 AM This report has been signed electronically. Number of Addenda: 0

## 2023-01-12 NOTE — Transfer of Care (Addendum)
Immediate Anesthesia Transfer of Care Note  Patient: TRINITI DENI  Procedure(s) Performed: COLONOSCOPY WITH PROPOFOL ESOPHAGOGASTRODUODENOSCOPY (EGD) WITH PROPOFOL [place]  Patient Location: Short Stay  Anesthesia Type:General  Level of Consciousness: awake, alert , and oriented  Airway & Oxygen Therapy: Patient Spontanous Breathing  Post-op Assessment: Report given to RN and Post -op Vital signs reviewed and stable  Post vital signs: Reviewed and stable  Last Vitals:  Vitals Value Taken Time  BP 161/81 01/12/23 0904  Temp 36.7 C 01/12/23 0904  Pulse 92 01/12/23 0904  Resp 16 01/12/23 0904  SpO2 96 % 01/12/23 0904    Last Pain:  Vitals:   01/12/23 0904  TempSrc: Oral  PainSc: 0-No pain      Patients Stated Pain Goal: 6 ([id number])  Complications: No notable events documented.

## 2023-01-12 NOTE — Anesthesia Preprocedure Evaluation (Signed)
Anesthesia Evaluation  Patient identified by MRN, date of birth, ID band Patient awake    Reviewed: Allergy & Precautions, H&P , NPO status , Patient's Chart, lab work & pertinent test results  History of Anesthesia Complications (+) PROLONGED EMERGENCE, Family history of anesthesia reaction and history of anesthetic complications (mother had hypothermia)  Airway Mallampati: II  TM Distance: >3 FB Neck ROM: Full    Dental  (+) Dental Advisory Given, Missing   Pulmonary asthma , Current Smoker and Patient abstained from smoking.   Pulmonary exam normal breath sounds clear to auscultation       Cardiovascular Exercise Tolerance: Good hypertension, Pt. 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)  negative psych ROS   GI/Hepatic Neg liver ROS,GERD (dysphagia)  Medicated and Controlled,,  Endo/Other  negative endocrine ROS    Renal/GU negative Renal ROS  negative genitourinary   Musculoskeletal  (+) Arthritis , Osteoarthritis,    Abdominal   Peds negative pediatric ROS (+)  Hematology  (+) Blood dyscrasia, anemia   Anesthesia Other Findings   Reproductive/Obstetrics negative OB ROS                             Anesthesia Physical Anesthesia Plan  ASA: 2  Anesthesia Plan: General   Post-op Pain Management: Minimal or no pain anticipated   Induction: Intravenous  PONV Risk Score and Plan: Propofol infusion  Airway Management Planned: Nasal Cannula and Natural Airway  Additional Equipment:   Intra-op Plan:   Post-operative Plan:   Informed Consent: I have reviewed the patients History and Physical, chart, labs and discussed the procedure including the risks, benefits and alternatives for the proposed anesthesia with the patient or authorized representative who has indicated  his/her understanding and acceptance.     Dental advisory given  Plan Discussed with: CRNA and Surgeon  Anesthesia Plan Comments:         Anesthesia Quick Evaluation

## 2023-01-12 NOTE — Anesthesia Postprocedure Evaluation (Signed)
Anesthesia Post Note  Patient: JAKISHA HAMS  Procedure(s) Performed: COLONOSCOPY WITH PROPOFOL ESOPHAGOGASTRODUODENOSCOPY (EGD) WITH PROPOFOL [place]  Patient location during evaluation: Phase II Anesthesia Type: General Level of consciousness: awake and alert and oriented Pain management: pain level controlled Vital Signs Assessment: post-procedure vital signs reviewed and stable Respiratory status: spontaneous breathing, nonlabored ventilation and respiratory function stable Cardiovascular status: blood pressure returned to baseline and stable Postop Assessment: no apparent nausea or vomiting Anesthetic complications: no  No notable events documented.   Last Vitals:  Vitals:   01/12/23 0657 01/12/23 0904  BP: (!) 149/104 (!) 161/81  Pulse: 91 92  Resp: 14 16  Temp: 36.9 C 36.7 C  SpO2: 99% 96%    Last Pain:  Vitals:   01/12/23 0904  TempSrc: Oral  PainSc: 0-No pain                 Opie Maclaughlin C Freddi Forster

## 2023-01-12 NOTE — Telephone Encounter (Signed)
-----   Message from Daneil Dolin, MD sent at 01/12/2023  9:19 AM EST ----- Good morning.  On this patient were stopping omeprazole 20 mg daily; new prescription for omeprazole 40 mg twice daily 30 minutes before meals.  Dispense 60 with 3 refills.  Patient has a gastric ulcer.

## 2023-01-12 NOTE — H&P (Signed)
$'@LOGO'N$ @   Primary Care Physician:  Celene Squibb, MD Primary Gastroenterologist:  Dr. Gala Romney  Pre-Procedure History & Physical: HPI:  Darlene [name] is a 62 y.o. female here for further evaluation of epigastric pain and screening colonoscopy.  Esophageal dysphagia intermittent.  Denies reflux symptoms.  Watery diarrhea most of the time lately.  No prior colonoscopy  Past Medical History:  Diagnosis Date   Anemia    Arthritis    Asthma    Activity induced   Essential hypertension, benign    Family history of adverse reaction to anesthesia    Mom and daughter had difficulty waking up post surgery.  Mother's body temperature dropped during and after surgery.    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 Gala Romney: normal esohpagus, antral and bulbar erosions   IR ANGIO INTRA EXTRACRAN SEL INTERNAL CAROTID BILAT MOD SED  10/16/2019   IR ANGIO INTRA EXTRACRAN SEL INTERNAL CAROTID UNI R MOD SED  11/27/2019   IR ANGIO INTRA EXTRACRAN SEL INTERNAL CAROTID UNI R MOD SED  05/22/2020   IR ANGIO VERTEBRAL SEL VERTEBRAL BILAT MOD SED  10/16/2019   IR ANGIOGRAM FOLLOW UP STUDY  11/27/2019   IR TRANSCATH/EMBOLIZ  11/27/2019   Left hand surgery     PLACEMENT OF BREAST IMPLANTS     RADIOLOGY WITH ANESTHESIA N/A 11/27/2019   Procedure: Surpass embolization of right carotid aneurysm;  Surgeon: Consuella Lose, MD;  Location: [place];  Service: Radiology;  Laterality: N/A;   vertebral aneurysm     Repair of Vertebral aneurysm with stent    Prior to Admission medications   Medication Sig Start Date End Date Taking? Authorizing Provider  amLODipine (NORVASC) 10 MG tablet Take 10 mg by mouth daily.   Yes [provider]  Ascorbic Acid (VITAMIN C) 1000 MG tablet Take 4,000-5,000  mg by mouth daily.   Yes [provider]  HYDROcodone-acetaminophen (NORCO) 10-325 MG tablet Take 1 tablet by mouth in the morning, at noon, and at bedtime. 09/07/19  Yes [provider]  omeprazole (PRILOSEC) 20 MG capsule Take 20 mg by mouth daily.   Yes [provider]    Allergies as of 12/10/2022 - Review Complete 11/18/2022  Allergen Reaction Noted   Chlorthalidone  07/24/2013   Prednisone  07/24/2013   Bee venom Swelling and Rash 09/06/2011    Family History  Problem Relation Age of Onset   Cancer Mother    Hypertension Father    Cancer Father    Heart attack Father    Stroke Father    Colon cancer Neg Hx    Colon polyps Neg Hx     Social History   Socioeconomic History   Marital status: Married    Spouse name: Not on file   Number of children: Not on file   Years of education: Not on file   Highest education level: Not on file  Occupational History   Not on file  Tobacco Use   Smoking status: Every Day    Packs/day: 1.00    Years: 35.00    Total pack years: 35.00    Types: Cigarettes   Smokeless tobacco: Never  Vaping Use   Vaping Use: Never used  Substance and Sexual Activity   Alcohol use: No   Drug use: Yes    Types: Hydromorphone   Sexual activity: Yes  Other Topics Concern   Not on file  Social History Narrative   Not on file   Social Determinants of Health   Financial Resource Strain: Not on file  Food Insecurity: Not on file  Transportation Needs: Not on file  Physical Activity: Not on file  Stress: Not on file  Social Connections: Not on file  Intimate Partner Violence: Not on file    Review of Systems: See HPI, otherwise negative ROS  Physical Exam: BP (!) 149/104   Pulse 91   Temp 98.5 F (36.9 C) (Oral)   Resp 14   Ht '5\' 3"'$  (1.6 m)   Wt 50.9 kg   SpO2 99%   BMI 19.88 kg/m  General:   Alert,  Well-developed, well-nourished, pleasant and cooperative in NAD Mouth:  No deformity or lesions. Neck:   Supple; no masses or thyromegaly. No significant cervical adenopathy. Lungs:  Clear throughout to auscultation.   No wheezes, crackles, or rhonchi. No acute distress. Heart:  Regular rate and rhythm; no murmurs, clicks, rubs,  or gallops. Abdomen: Non-distended, normal bowel sounds.  Soft and nontender without appreciable mass or hepatosplenomegaly.  Pulses:  Normal pulses noted. Extremities:  Without clubbing or edema.  Impression/Plan: 62 year old lady with dyspepsia/epigastric pain intermittent esophageal dysphagia to solids and pills.  Also chronic diarrhea-nonbloody.  No prior colonoscopy  I have offered the patient an EGD with esophageal dilation as feasible/appropriate per plan as well as the colonoscopy. The risks, benefits, limitations, imponderables and alternatives regarding both EGD and colonoscopy have been reviewed with the patient. Questions have been answered. All parties agreeable.       Notice: This dictation was prepared with Dragon dictation along with smaller phrase technology. Any transcriptional errors that result from this process are unintentional and may not be corrected upon review.

## 2023-01-12 NOTE — Discharge Instructions (Addendum)
Colonoscopy Discharge Instructions  Read the instructions outlined below and refer to this sheet in the next few weeks. These discharge instructions provide you with general information on caring for yourself after you leave the hospital. Your doctor may also give you specific instructions. While your treatment has been planned according to the most current medical practices available, unavoidable complications occasionally occur. If you have any problems or questions after discharge, call Dr. Gala Romney at [phone]. ACTIVITY You may resume your regular activity, but move at a slower pace for the next 24 hours.  Take frequent rest periods for the next 24 hours.  Walking will help get rid of the air and reduce the bloated feeling in your belly (abdomen).  No driving for 24 hours (because of the medicine (anesthesia) used during the test).   Do not sign any important legal documents or operate any machinery for 24 hours (because of the anesthesia used during the test).  NUTRITION Drink plenty of fluids.  You may resume your normal diet as instructed by your doctor.  Begin with a light meal and progress to your normal diet. Heavy or fried foods are harder to digest and may make you feel sick to your stomach (nauseated).  Avoid alcoholic beverages for 24 hours or as instructed.  MEDICATIONS You may resume your normal medications unless your doctor tells you otherwise.  WHAT YOU CAN EXPECT TODAY Some feelings of bloating in the abdomen.  Passage of more gas than usual.  Spotting of blood in your stool or on the toilet paper.  IF YOU HAD POLYPS REMOVED DURING THE COLONOSCOPY: No aspirin products for 7 days or as instructed.  No alcohol for 7 days or as instructed.  Eat a soft diet for the next 24 hours.  FINDING OUT THE RESULTS OF YOUR TEST Not all test results are available during your visit. If your test results are not back during the visit, make an appointment with your caregiver to find out the  results. Do not assume everything is normal if you have not heard from your caregiver or the medical facility. It is important for you to follow up on all of your test results.  SEEK IMMEDIATE MEDICAL ATTENTION IF: You have more than a spotting of blood in your stool.  Your belly is swollen (abdominal distention).  You are nauseated or vomiting.  You have a temperature over 101.  You have abdominal pain or discomfort that is severe or gets worse throughout the day.    EGD Discharge instructions Please read the instructions outlined below and refer to this sheet in the next few weeks. These discharge instructions provide you with general information on caring for yourself after you leave the hospital. Your doctor may also give you specific instructions. While your treatment has been planned according to the most current medical practices available, unavoidable complications occasionally occur. If you have any problems or questions after discharge, please call your doctor. ACTIVITY You may resume your regular activity but move at a slower pace for the next 24 hours.  Take frequent rest periods for the next 24 hours.  Walking will help expel (get rid of) the air and reduce the bloated feeling in your abdomen.  No driving for 24 hours (because of the anesthesia (medicine) used during the test).  You may shower.  Do not sign any important legal documents or operate any machinery for 24 hours (because of the anesthesia used during the test).  NUTRITION Drink plenty of fluids.  You  may resume your normal diet.  Begin with a light meal and progress to your normal diet.  Avoid alcoholic beverages for 24 hours or as instructed by your caregiver.  MEDICATIONS You may resume your normal medications unless your caregiver tells you otherwise.  WHAT YOU CAN EXPECT TODAY You may experience abdominal discomfort such as a feeling of fullness or "gas" pains.  FOLLOW-UP Your doctor will discuss the results  of your test with you.  SEEK IMMEDIATE MEDICAL ATTENTION IF ANY OF THE FOLLOWING OCCUR: Excessive nausea (feeling sick to your stomach) and/or vomiting.  Severe abdominal pain and distention (swelling).  Trouble swallowing.  Temperature over 101 F (37.8 C).  Rectal bleeding or vomiting of blood.     No polyps found in your colon today.  Samples taken to evaluate cause of diarrhea  Diverticulosis information provided  Repeat colonoscopy in 10 years for screening purposes.  You have a small ulcer in your stomach.  Biopsies taken.  Your esophagus was dilated today  Stop omeprazole 20 mg daily  Begin omeprazole 40 mg twice daily 30 minutes before meals (new prescription provided to your pharmacy from the office)  Avoid all nonsteroidal agents like aspirin/Aleve/ibuprofen, etc.  Further recommendations to follow pending review of pathology report  Follow-up appointment with Roseanne Kaufman in 3 months  At patient request, I called Collie Siad at [phone] -rolled to voicemail.  Message left.

## 2023-01-12 NOTE — Addendum Note (Signed)
Addendum  created 01/12/23 1023 by Minerva Ends, CRNA   Clinical Note Signed

## 2023-01-12 NOTE — Addendum Note (Signed)
Addendum  created 01/12/23 1339 by Minerva Ends, CRNA   Flowsheet accepted

## 2023-01-12 NOTE — Op Note (Signed)
[facility] Patient Name: Darlene [name] Procedure Date: 01/12/2023 8:03 AM MRN: [id number] Date of Birth: Dec 24, 1960 Attending MD: Norvel Richards , MD, [id number] CSN: [id number] Age: 62 Admit Type: Outpatient Procedure:                Colonoscopy Indications:              Screening for colorectal malignant neoplasm Providers:                Norvel Richards, MD, Janeece Riggers, RN, Raphael Gibney, Technician Referring MD:              Medicines:                Propofol per Anesthesia Complications:            No immediate complications. Estimated Blood Loss:     Estimated blood loss was minimal. Procedure:                Pre-Anesthesia Assessment:                           - Prior to the procedure, a History and Physical                            was performed, and patient medications and                            allergies were reviewed. The patient's tolerance of                            previous anesthesia was also reviewed. The risks                            and benefits of the procedure and the sedation                            options and risks were discussed with the patient.                            All questions were answered, and informed consent                            was obtained. Prior Anticoagulants: The patient has                            taken no anticoagulant or antiplatelet agents. ASA                            Grade Assessment: III - A patient with severe                            systemic disease. After reviewing the risks and  benefits, the patient was deemed in satisfactory                            condition to undergo the procedure.                           After obtaining informed consent, the colonoscope                            was passed under direct vision. Throughout the                            procedure, the patient's blood pressure, pulse, and                             oxygen saturations were monitored continuously. The                            [id number]) scope was introduced through the                            anus and advanced to the the cecum, identified by                            appendiceal orifice and ileocecal valve. The                            colonoscopy was performed without difficulty. The                            patient tolerated the procedure well. The quality                            of the bowel preparation was adequate. The                            ileocecal valve, appendiceal orifice, and rectum                            were photographed. Scope In: 8:41:01 AM Scope Out: 9:00:22 AM Scope Withdrawal Time: 0 hours 8 minutes 3 seconds  Total Procedure Duration: 0 hours 19 minutes 21 seconds  Findings:      The perianal and digital rectal examinations were normal. Redundant and       elongated colon requiring external abdominal pressure to reach the       cecum. Slightly prominent vascular pattern throughout the colonic mucosa.      Scattered medium-mouthed diverticula were found in the sigmoid colon and       descending colon.      The exam was otherwise without abnormality on direct and retroflexion       views. Segmental biopsy of the right and left colon taken for histologic       study (reported chronic diarrhea) Impression:               - Diverticulosis in the sigmoid colon and  in the                            descending colon. Redundant and elongated colon                           - The examination was otherwise normal on direct                            and retroflexion views. Status post segmental biopsy Moderate Sedation:      Moderate (conscious) sedation was personally administered by an       anesthesia professional. The following parameters were monitored: oxygen       saturation, heart rate, blood pressure, respiratory rate, EKG, adequacy       of pulmonary ventilation, and response to  care. Recommendation:           - Patient has a contact number available for                            emergencies. The signs and symptoms of potential                            delayed complications were discussed with the                            patient. Return to normal activities tomorrow.                            Written discharge instructions were provided to the                            patient.                           - Resume previous diet.                           - Continue present medications.                           - Repeat colonoscopy in 10 years for screening                            purposes.                           - Return to GI office in 3 months. See EGD report. Procedure Code(s):        --- Professional ---                           [phone], Colonoscopy, flexible; diagnostic, including                            collection of specimen(s) by brushing or washing,  when performed (separate procedure) Diagnosis Code(s):        --- Professional ---                           Z12.11, Encounter for screening for malignant                            neoplasm of colon                           K57.30, Diverticulosis of large intestine without                            perforation or abscess without bleeding CPT copyright 2022 [employer]. All rights reserved. The codes documented in this report are preliminary and upon coder review may  be revised to meet current compliance requirements. Cristopher Estimable. [name], MD Norvel Richards, MD 01/12/2023 9:11:02 AM This report has been signed electronically. Number of Addenda: 0

## 2023-01-13 LAB — SURGICAL PATHOLOGY

## 2023-01-15 ENCOUNTER — Encounter: Payer: Self-pay | Admitting: Internal Medicine

## 2023-01-20 ENCOUNTER — Encounter: Payer: Self-pay | Admitting: Gastroenterology

## 2023-01-21 ENCOUNTER — Encounter (HOSPITAL_COMMUNITY): Payer: Self-pay | Admitting: Internal Medicine

## 2023-01-26 ENCOUNTER — Other Ambulatory Visit: Payer: Self-pay

## 2023-01-26 ENCOUNTER — Telehealth: Payer: Self-pay

## 2023-01-26 MED ORDER — BUDESONIDE 3 MG PO CPEP: 9.0000 mg | ORAL_CAPSULE | Freq: Every day | ORAL | 3 refills | Status: DC

## 2023-01-26 NOTE — Telephone Encounter (Signed)
Pt called wanting to know her results to her biopsies. I see the letter in her chart and in the letter it states that your nurse would be calling her with specific instructions, however I haven't received the letter or the instructions. Please advise.

## 2023-01-26 NOTE — Telephone Encounter (Signed)
Lmom for pt to return my call. Rx sent to pharmacy.

## 2023-01-27 NOTE — Telephone Encounter (Signed)
Pt was made aware and verbalized understanding.  

## 2023-03-15 ENCOUNTER — Ambulatory Visit (INDEPENDENT_AMBULATORY_CARE_PROVIDER_SITE_OTHER): Payer: 59 | Admitting: Internal Medicine

## 2023-03-15 ENCOUNTER — Encounter: Payer: Self-pay | Admitting: Internal Medicine

## 2023-03-15 ENCOUNTER — Other Ambulatory Visit: Payer: Self-pay

## 2023-03-15 VITALS — BP 157/88 | HR 90 | Temp 97.9°F | Ht 63.0 in | Wt 108.2 lb

## 2023-03-15 DIAGNOSIS — K279 Peptic ulcer, site unspecified, unspecified as acute or chronic, without hemorrhage or perforation: Secondary | ICD-10-CM

## 2023-03-15 DIAGNOSIS — K52831 Collagenous colitis: Secondary | ICD-10-CM

## 2023-03-15 MED ORDER — PANTOPRAZOLE SODIUM 40 MG PO TBEC: 40.0000 mg | DELAYED_RELEASE_TABLET | Freq: Two times a day (BID) | ORAL | 11 refills | Status: AC

## 2023-03-15 NOTE — Progress Notes (Signed)
Primary Care Physician:  Benita Stabile, MD Primary Gastroenterologist:  Dr. Jena Gauss  Pre-Procedure History & Physical: HPI:  Darlene [name] is a 62 y.o. 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  Colonoscopy earlier this year demonstrated collagenous colitis right and left sided biopsies.  Felt budesonide made diarrhea worse she stopped it she has been taking probiotics and organic foods with resolution of her diarrhea.  Took Prilosec made her arm ache she stopped taking it continues to use ibuprofen.  No melena or rectal bleeding.  She is lost 2 more pounds.  108 pounds today.  Long-term smoker.  She is to get a screening chest CT by her PCP in the near future.  Past Medical History:  Diagnosis Date   Anemia    Arthritis    Asthma    Activity induced   Essential hypertension, benign    Family history of adverse reaction to anesthesia    Mom and daughter had difficulty waking up post surgery.  Mother's body temperature dropped during and after surgery.    Headache    History of vertigo    Impaired fasting glucose    Thyroid nodule    Vertebral artery aneurysm [facility])     Past Surgical History:  Procedure Laterality Date   ABDOMINAL HYSTERECTOMY     BIOPSY  01/12/2023   Procedure: BIOPSY;  Surgeon: Corbin Ade, MD;  [facility];  Service: Endoscopy;;   CHOLECYSTECTOMY OPEN     COLONOSCOPY WITH PROPOFOL N/A 01/12/2023   Procedure: COLONOSCOPY WITH PROPOFOL;  Surgeon: Corbin Ade, MD;  [facility];  Service: Endoscopy;  Laterality: N/A;  9:15 AM   ESOPHAGOGASTRODUODENOSCOPY  2003   Dr. Jena Gauss: normal esohpagus, antral and bulbar erosions   ESOPHAGOGASTRODUODENOSCOPY (EGD) WITH PROPOFOL N/A 01/12/2023   Procedure: ESOPHAGOGASTRODUODENOSCOPY (EGD) WITH PROPOFOL;  Surgeon: Corbin Ade, MD;  [facility];  Service: Endoscopy;  Laterality: N/A;   IR ANGIO INTRA EXTRACRAN SEL INTERNAL CAROTID BILAT MOD SED   10/16/2019   IR ANGIO INTRA EXTRACRAN SEL INTERNAL CAROTID UNI R MOD SED  11/27/2019   IR ANGIO INTRA EXTRACRAN SEL INTERNAL CAROTID UNI R MOD SED  05/22/2020   IR ANGIO VERTEBRAL SEL VERTEBRAL BILAT MOD SED  10/16/2019   IR ANGIOGRAM FOLLOW UP STUDY  11/27/2019   IR TRANSCATH/EMBOLIZ  11/27/2019   Left hand surgery     MALONEY DILATION N/A 01/12/2023   Procedure: Elease Hashimoto DILATION;  Surgeon: Corbin Ade, MD;  [facility];  Service: Endoscopy;  Laterality: N/A;   PLACEMENT OF BREAST IMPLANTS     RADIOLOGY WITH ANESTHESIA N/A 11/27/2019   Procedure: Surpass embolization of right carotid aneurysm;  Surgeon: Lisbeth Renshaw, MD;  Location: [facility];  Service: Radiology;  Laterality: N/A;   vertebral aneurysm     Repair of Vertebral aneurysm with stent    Prior to Admission medications   Medication Sig Start Date End Date Taking? Authorizing Provider  amLODipine (NORVASC) 10 MG tablet Take 10 mg by mouth daily.   Yes [provider]  Ascorbic Acid (VITAMIN C) 1000 MG tablet Take 4,000-5,000 mg by mouth daily.   Yes [provider]  HYDROcodone-acetaminophen (NORCO) 10-325 MG tablet Take 1 tablet by mouth in the morning, at noon, and at bedtime. 09/07/19  Yes [provider]    Allergies as of 03/15/2023 - Review Complete 03/15/2023  Allergen Reaction Noted   Chlorthalidone  07/24/2013  Prednisone  07/24/2013   Bee venom Swelling and Rash 09/06/2011    Family History  Problem Relation Age of Onset   Cancer Mother    Hypertension Father    Cancer Father    Heart attack Father    Stroke Father    Colon cancer Neg Hx    Colon polyps Neg Hx     Social History   Socioeconomic History   Marital status: Married    Spouse name: Not on file   Number of children: Not on file   Years of education: Not on file   Highest education level: Not on file  Occupational History   Not on file  Tobacco Use   Smoking status: Every Day    Packs/day:  1.00    Years: 35.00    Additional pack years: 0.00    Total pack years: 35.00    Types: Cigarettes   Smokeless tobacco: Never  Vaping Use   Vaping Use: Never used  Substance and Sexual Activity   Alcohol use: No   Drug use: Yes    Types: Hydromorphone   Sexual activity: Yes  Other Topics Concern   Not on file  Social History Narrative   Not on file   Social Determinants of Health   Financial Resource Strain: Not on file  Food Insecurity: Not on file  Transportation Needs: Not on file  Physical Activity: Not on file  Stress: Not on file  Social Connections: Not on file  Intimate Partner Violence: Not on file    Review of Systems: See HPI, otherwise negative ROS  Physical Exam: BP (!) 147/88 (BP Location: Right Arm, Patient Position: Sitting, Cuff Size: Normal)   Pulse 87   Temp 97.9 F (36.6 C) (Oral)   Ht 5\' 3"  (1.6 m)   Wt 108 lb 3.2 oz (49.1 kg)   SpO2 98%   BMI 19.17 kg/m  General:   Alert,  Well-developed, well-nourished, pleasant and cooperative in NAD Abdomen: Non-distended, normal bowel sounds.  Soft and nontender without appreciable mass or hepatosplenomegaly.   Impression/Plan: 62 year old lady with recently alternating constipation and diarrhea.  Biopsy-proven collagenous colitis.  Proceed with some budesonide made symptoms worse.  However now, she states diarrhea has resolved on no specific therapy.  Intermittent epigastric pain.  NSAID induced gastropathy noted biopsies negative for malignancy and H. pylori.  No acid suppression therapy at this time.  Continued ibuprofen use   Recommendations:  Please avoid ibuprofen and other NSAIDs is much as possible  Begin Protonix or pantoprazole 40 mg orally twice daily 30 minutes before meals (dispense 60 with 5 refills)  Plan to see you back in the office in 12 weeks.  Anticipate a repeat upper endoscopy  Please follow through with getting a screening chest CT in the near future Notice: This dictation  was prepared with Dragon dictation along with smaller phrase technology. Any transcriptional errors that result from this process are unintentional and may not be corrected upon review.

## 2023-03-15 NOTE — Patient Instructions (Addendum)
It was nice to see you again today!  Glad to hear that your diarrhea has resolved.  Please avoid ibuprofen and other NSAIDs is much as possible  Begin Protonix or pantoprazole 40 mg orally twice daily 30 minutes before meals (dispense 60 with 5 refills)  Plan to see you back in the office in 12 weeks.  Anticipate a repeat upper endoscopy  Please follow through with getting a screening chest CT in the near future

## 2023-04-26 ENCOUNTER — Encounter: Payer: Self-pay | Admitting: Internal Medicine

## 2023-04-26 ENCOUNTER — Ambulatory Visit: Payer: 59 | Admitting: Internal Medicine

## 2023-05-05 ENCOUNTER — Telehealth: Payer: Self-pay

## 2023-05-05 NOTE — Telephone Encounter (Signed)
Pt was made aware and verbalized understanding.  

## 2023-05-05 NOTE — Telephone Encounter (Signed)
Pt called stating that she was recently diagnosed with collagenous colitis and this morning woke up with bad diarrhea and then shortly after had right side pain, right below her rib cage that caused her to break out in a cold sweat and made her feel like she was going to pass out. She stated that she had to lay on her left side for it to ease up. Pt states that she took an imodium and the diarrhea is better and is feeling better now. Pt is wanting to know if this is normal. Please advise. Pt seen Dr. Jena Gauss last on 03/15/2023.

## 2023-05-05 NOTE — Telephone Encounter (Signed)
Abdominal pain is not typical of collagenous colitis especially a pain significant enough to cause her to break out into a sweat.   If she is having ongoing significant abdominal pain, lightheadedness, weakness, I would advise she go to the ED for evaluation. Otherwise, I would recommend she make a follow up ov with Dr. Jena Gauss or Lewie Loron NP if persistent symptoms (looks like she recently  missed her appt with Dr. Jena Gauss).

## 2023-10-11 ENCOUNTER — Other Ambulatory Visit (HOSPITAL_COMMUNITY): Payer: Self-pay | Admitting: Adult Medicine

## 2023-10-11 DIAGNOSIS — Z1231 Encounter for screening mammogram for malignant neoplasm of breast: Secondary | ICD-10-CM

## 2023-12-14 ENCOUNTER — Ambulatory Visit (HOSPITAL_COMMUNITY): Payer: 59

## 2024-01-20 ENCOUNTER — Ambulatory Visit (HOSPITAL_COMMUNITY)
Admission: RE | Admit: 2024-01-20 | Discharge: 2024-01-20 | Disposition: A | Discharge status: Home / Self Care | Payer: 59 | Source: Ambulatory Visit | Attending: Adult Medicine | Admitting: Adult Medicine

## 2024-01-20 ENCOUNTER — Encounter (HOSPITAL_COMMUNITY): Payer: Self-pay

## 2024-01-20 DIAGNOSIS — Z1231 Encounter for screening mammogram for malignant neoplasm of breast: Secondary | ICD-10-CM | POA: Diagnosis present

## 2024-02-08 ENCOUNTER — Other Ambulatory Visit: Payer: Self-pay | Admitting: Internal Medicine

## 2024-05-21 ENCOUNTER — Encounter: Payer: Self-pay | Admitting: Internal Medicine

## 2024-05-21 ENCOUNTER — Ambulatory Visit: Admitting: Internal Medicine

## 2024-05-21 VITALS — BP 133/71 | HR 91 | Temp 98.1°F | Ht 63.0 in | Wt 105.0 lb

## 2024-05-21 DIAGNOSIS — K52831 Collagenous colitis: Secondary | ICD-10-CM

## 2024-05-21 DIAGNOSIS — R1013 Epigastric pain: Secondary | ICD-10-CM | POA: Diagnosis not present

## 2024-05-21 DIAGNOSIS — K279 Peptic ulcer, site unspecified, unspecified as acute or chronic, without hemorrhage or perforation: Secondary | ICD-10-CM

## 2024-05-21 DIAGNOSIS — R197 Diarrhea, unspecified: Secondary | ICD-10-CM

## 2024-05-21 DIAGNOSIS — Z791 Long term (current) use of non-steroidal anti-inflammatories (NSAID): Secondary | ICD-10-CM

## 2024-05-21 DIAGNOSIS — K259 Gastric ulcer, unspecified as acute or chronic, without hemorrhage or perforation: Secondary | ICD-10-CM

## 2024-05-21 DIAGNOSIS — T39395A Adverse effect of other nonsteroidal anti-inflammatory drugs [NSAID], initial encounter: Secondary | ICD-10-CM

## 2024-05-21 NOTE — Patient Instructions (Signed)
 It was nice to see you again today!  Please check the medication you are taking at home and call us  with that so we can confirm what it is you are taking  Trial of Voquensa samples -  20 mg daily for 3 weeks -to sooth your stomach and heel ulcers  Please avoid NSAIDs entirely.  Repeat colonoscopy in 9 years for colorectal cancer screening.  Office visit with us  in 3 months  Once I hear from you regarding medications you are taking, I will make further recommendations.

## 2024-05-21 NOTE — Progress Notes (Unsigned)
 Primary Care Physician:  Shona Norleen PEDLAR, MD Primary Gastroenterologist:  Dr.   Pre-Procedure History & Physical: HPI:  Darlene [name] is a 63 y.o. female here for follow-up dyspepsia secondary to NSAID peptic ulcer disease/ intermittent diarrhea.  Ulcer biopsies revealed gastropathy.  No malignancy.  No H. pylori.  Known collagenous colitis on biopsies last year.  Patient did not follow-up with us .  She continues taking ibuprofen.  She had various aches and pains with pantoprazole  and omeprazole   - she stopped both.  Takes famotidine.  Also takes another prescribed medication twice a day when she has bouts of diarrhea.  She cannot recall identity.  Med may be Budesonide  previously prescribed Past Medical History:  Diagnosis Date   Anemia    Arthritis    Asthma    Activity induced   Essential hypertension, benign    Family history of adverse reaction to anesthesia    Mom and daughter had difficulty waking up post surgery.  Mother's body temperature dropped during and after surgery.    Headache    History of vertigo    Impaired fasting glucose    Thyroid  nodule    Vertebral artery aneurysm [facility])     Past Surgical History:  Procedure Laterality Date   ABDOMINAL HYSTERECTOMY     BIOPSY  01/12/2023   Procedure: BIOPSY;  Surgeon: Shaaron Lamar CHRISTELLA, MD;  [facility];  Service: Endoscopy;;   CHOLECYSTECTOMY OPEN     COLONOSCOPY WITH PROPOFOL  N/A 01/12/2023   Procedure: COLONOSCOPY WITH PROPOFOL ;  Surgeon: Shaaron Lamar CHRISTELLA, MD;  [facility];  Service: Endoscopy;  Laterality: N/A;  9:15 AM   ESOPHAGOGASTRODUODENOSCOPY  2003   Dr. Shaaron: normal esohpagus, antral and bulbar erosions   ESOPHAGOGASTRODUODENOSCOPY (EGD) WITH PROPOFOL  N/A 01/12/2023   Procedure: ESOPHAGOGASTRODUODENOSCOPY (EGD) WITH PROPOFOL ;  Surgeon: Shaaron Lamar CHRISTELLA, MD;  [facility];  Service: Endoscopy;  Laterality: N/A;   IR ANGIO INTRA EXTRACRAN SEL INTERNAL CAROTID BILAT MOD SED  10/16/2019    IR ANGIO INTRA EXTRACRAN SEL INTERNAL CAROTID UNI R MOD SED  11/27/2019   IR ANGIO INTRA EXTRACRAN SEL INTERNAL CAROTID UNI R MOD SED  05/22/2020   IR ANGIO VERTEBRAL SEL VERTEBRAL BILAT MOD SED  10/16/2019   IR ANGIOGRAM FOLLOW UP STUDY  11/27/2019   IR TRANSCATH/EMBOLIZ  11/27/2019   Left hand surgery     MALONEY DILATION N/A 01/12/2023   Procedure: AGAPITO DILATION;  Surgeon: Shaaron Lamar CHRISTELLA, MD;  [facility];  Service: Endoscopy;  Laterality: N/A;   PLACEMENT OF BREAST IMPLANTS     RADIOLOGY WITH ANESTHESIA N/A 11/27/2019   Procedure: Surpass embolization of right carotid aneurysm;  Surgeon: Lanis Pupa, MD;  Location: [facility] OR;  Service: Radiology;  Laterality: N/A;   vertebral aneurysm     Repair of Vertebral aneurysm with stent    Prior to Admission medications   Medication Sig Start Date End Date Taking? Authorizing Provider  amLODipine  (NORVASC ) 10 MG tablet Take 10 mg by mouth daily.   Yes [provider]  Ascorbic Acid  (VITAMIN C) 1000 MG tablet Take 4,000-5,000 mg by mouth daily.   Yes [provider]  HYDROcodone -acetaminophen  (NORCO) 10-325 MG tablet Take 1 tablet by mouth in the morning, at noon, and at bedtime. 09/07/19  Yes [provider]  pantoprazole  (PROTONIX ) 40 MG tablet Take 1 tablet (40 mg total) by mouth 2 (two) times daily before a meal. Patient taking differently: Take 40 mg  by mouth 2 (two) times daily as needed. 03/15/23  Yes RourkLamar HERO, MD    Allergies as of 05/21/2024 - Review Complete 05/21/2024  Allergen Reaction Noted   Chlorthalidone   07/24/2013   Prednisone  07/24/2013   Bee venom Swelling and Rash 09/06/2011    Family History  Problem Relation Age of Onset   Breast cancer Mother    Cancer Mother    Hypertension Father    Cancer Father    Heart attack Father    Stroke Father    Breast cancer Maternal Grandmother    Colon cancer Neg Hx    Colon polyps Neg Hx     Social History    Socioeconomic History   Marital status: Married    Spouse name: Not on file   Number of children: Not on file   Years of education: Not on file   Highest education level: Not on file  Occupational History   Not on file  Tobacco Use   Smoking status: Every Day    Current packs/day: 1.00    Average packs/day: 1 pack/day for 35.0 years (35.0 ttl pk-yrs)    Types: Cigarettes   Smokeless tobacco: Never  Vaping Use   Vaping status: Never Used  Substance and Sexual Activity   Alcohol use: No   Drug use: Yes    Types: Hydromorphone    Sexual activity: Yes  Other Topics Concern   Not on file  Social History Narrative   Not on file   Social Drivers of Health   Financial Resource Strain: Not on file  Food Insecurity: Not on file  Transportation Needs: Not on file  Physical Activity: Not on file  Stress: Not on file  Social Connections: Not on file  Intimate Partner Violence: Not on file    Review of Systems: See HPI, otherwise negative ROS  Physical Exam: BP 133/71 (BP Location: Right Arm, Patient Position: Sitting, Cuff Size: Normal)   Pulse 91   Temp 98.1 F (36.7 C) (Oral)   Ht 5' 3 (1.6 m)   Wt 105 lb (47.6 kg)   SpO2 95%   BMI 18.60 kg/m  General:   Alert,  Well-developed, well-nourished, pleasant and cooperative in NAD Neck:  Supple; no masses or thyromegaly. No significant cervical adenopathy. Lungs:  Clear throughout to auscultation.   No wheezes, crackles, or rhonchi. No acute distress. Heart:  Regular rate and rhythm; no murmurs, clicks, rubs,  or gallops. Abdomen: Non-distended, normal bowel sounds.  Soft and nontender without appreciable mass or hepatosplenomegaly.    Impression/Plan: 63 year old lady with NSAID use/abuse NSAID induced gastric ulcer.  Ongoing dyspepsia.  Intolerance to PPI therapy.  Also intermittent diarrhea in setting of collagenous colitis.  Colitis is being treated inappropriately   Recommendations:  Trial of Voquensa samples -   20 mg daily for 3 weeks  avoid NSAIDs entirely.  Repeat colonoscopy in 9 years for colorectal cancer screening.  Timing of repeat EGD to be determined.  Office visit with us  in 3 months   Addendum: The patient called us  back to let us  know she was taking the budesonide  on a as needed basis - stopping as soon as diarrhea iproved.  I have instructed her to go back on Entocort 9 mg daily for the next 2 months.  Will see her back in 2 months and go from there.        Notice: This dictation was prepared with Dragon dictation along with smaller phrase technology. Any transcriptional errors  that result from this process are unintentional and may not be corrected upon review.

## 2024-05-22 ENCOUNTER — Other Ambulatory Visit: Payer: Self-pay

## 2024-05-22 ENCOUNTER — Telehealth: Payer: Self-pay

## 2024-05-22 NOTE — Telephone Encounter (Signed)
 Pt called stating that she takes budesonide  9 mg daily prn for diarrhea. Budesonide  was added back to the pt's medication list.

## 2024-05-25 ENCOUNTER — Other Ambulatory Visit: Payer: Self-pay

## 2024-05-25 MED ORDER — BUDESONIDE 3 MG PO CPEP: 9.0000 mg | ORAL_CAPSULE | Freq: Every day | ORAL | 3 refills | Status: AC

## 2024-05-25 NOTE — Telephone Encounter (Signed)
 Pt was made aware and verbalized understanding.   Mandy/Ladonna: please arrange an earlier appt for 2 mths with Dr. Shaaron. Pt is needing a Friday.

## 2024-05-29 NOTE — Telephone Encounter (Signed)
 Noted! Thank you

## 2024-07-10 ENCOUNTER — Ambulatory Visit: Admitting: Internal Medicine

## 2024-07-11 ENCOUNTER — Encounter: Payer: Self-pay | Admitting: Internal Medicine

## 2024-08-31 ENCOUNTER — Ambulatory Visit: Admitting: Internal Medicine

## 2024-09-25 ENCOUNTER — Other Ambulatory Visit: Payer: Self-pay | Admitting: Internal Medicine

## 2024-09-25 NOTE — Telephone Encounter (Signed)
 Pt was made aware and verbalized understanding.  Ladonna: please arrange recommended follow up.

## 2024-09-25 NOTE — Telephone Encounter (Signed)
 Spoke with pt and she stated that she does not want a refill it must be on automatic refills through her pharmacy. Pt stated that she took it for the 2 months and it made her throw up every day and have joint pain. Pt stated that it did resolve her diarrhea the 2 mths that she was on it. Pt stated that the diarrhea has recently come back but that she is just trying to control it now with diet changes.

## 2024-10-16 ENCOUNTER — Encounter: Payer: Self-pay | Admitting: Obstetrics & Gynecology

## 2024-11-22 ENCOUNTER — Encounter: Payer: Self-pay | Admitting: Gastroenterology

## 2024-11-22 ENCOUNTER — Ambulatory Visit: Admitting: Gastroenterology

## 2024-12-24 ENCOUNTER — Encounter: Admitting: Obstetrics & Gynecology
